# Patient Record
Sex: Female | Born: 1970
Health system: Southern US, Community
[De-identification: ages and names within clinical notes are randomized; demographics above are authoritative.]

## PROBLEM LIST (undated history)

## (undated) DIAGNOSIS — E119 Type 2 diabetes mellitus without complications: Secondary | ICD-10-CM

## (undated) HISTORY — PX: TIBIA FRACTURE SURGERY: SHX806

## (undated) HISTORY — PX: BREAST BIOPSY: SHX20

---
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
## 2006-11-01 ENCOUNTER — Emergency Department: Payer: Self-pay

## 2007-01-28 ENCOUNTER — Ambulatory Visit: Payer: Self-pay | Admitting: Internal Medicine

## 2007-05-11 ENCOUNTER — Emergency Department: Payer: Self-pay | Admitting: Emergency Medicine

## 2007-07-15 ENCOUNTER — Emergency Department: Payer: Self-pay | Admitting: Emergency Medicine

## 2007-07-24 ENCOUNTER — Ambulatory Visit: Payer: Self-pay | Admitting: General Practice

## 2008-01-08 ENCOUNTER — Emergency Department: Payer: Self-pay | Admitting: Emergency Medicine

## 2008-02-18 ENCOUNTER — Ambulatory Visit: Payer: Self-pay | Admitting: Internal Medicine

## 2008-06-06 ENCOUNTER — Ambulatory Visit: Payer: Self-pay | Admitting: Internal Medicine

## 2008-06-07 ENCOUNTER — Ambulatory Visit: Payer: Self-pay | Admitting: Internal Medicine

## 2010-10-27 ENCOUNTER — Emergency Department: Payer: Self-pay | Admitting: Emergency Medicine

## 2010-11-10 ENCOUNTER — Emergency Department (HOSPITAL_COMMUNITY)
Admission: EM | Admit: 2010-11-10 | Discharge: 2010-11-10 | Payer: Self-pay | Source: Home / Self Care | Admitting: Family Medicine

## 2011-02-12 ENCOUNTER — Ambulatory Visit: Payer: Self-pay

## 2011-02-20 ENCOUNTER — Ambulatory Visit: Payer: Self-pay

## 2011-07-18 ENCOUNTER — Emergency Department: Payer: Self-pay | Admitting: Emergency Medicine

## 2011-12-20 ENCOUNTER — Inpatient Hospital Stay: Payer: Self-pay | Admitting: Internal Medicine

## 2011-12-20 LAB — URINALYSIS, COMPLETE
Nitrite: NEGATIVE
Ph: 5 (ref 4.5–8.0)
Protein: 30
RBC,UR: 37 /HPF (ref 0–5)
Specific Gravity: 1.028 (ref 1.003–1.030)
Squamous Epithelial: 13
WBC UR: 38 /HPF (ref 0–5)

## 2011-12-20 LAB — COMPREHENSIVE METABOLIC PANEL
Albumin: 4.2 g/dL (ref 3.4–5.0)
Alkaline Phosphatase: 111 U/L (ref 50–136)
Anion Gap: 17 — ABNORMAL HIGH (ref 7–16)
Calcium, Total: 9.1 mg/dL (ref 8.5–10.1)
Co2: 19 mmol/L — ABNORMAL LOW (ref 21–32)
EGFR (African American): >60
EGFR (Non-African Amer.): >60
Glucose: 412 mg/dL — ABNORMAL HIGH (ref 65–99)
Potassium: 4.5 mmol/L (ref 3.5–5.1)
SGOT(AST): 18 U/L (ref 15–37)
Total Protein: 9 g/dL — ABNORMAL HIGH (ref 6.4–8.2)

## 2011-12-20 LAB — BASIC METABOLIC PANEL
Anion Gap: 14 (ref 7–16)
BUN: 9 mg/dL (ref 7–18)
Co2: 21 mmol/L (ref 21–32)
Creatinine: 0.54 mg/dL — ABNORMAL LOW (ref 0.60–1.30)
EGFR (African American): >60
Glucose: 159 mg/dL — ABNORMAL HIGH (ref 65–99)
Osmolality: 281 (ref 275–301)

## 2011-12-20 LAB — CBC
HCT: 45.2 % (ref 35.0–47.0)
MCH: 29.4 pg (ref 26.0–34.0)
Platelet: 362 10*3/uL (ref 150–440)
RDW: 12.3 % (ref 11.5–14.5)
WBC: 8.9 10*3/uL (ref 3.6–11.0)

## 2011-12-21 LAB — BASIC METABOLIC PANEL
Anion Gap: 11 (ref 7–16)
BUN: 7 mg/dL (ref 7–18)
Co2: 23 mmol/L (ref 21–32)
Creatinine: 0.5 mg/dL — ABNORMAL LOW (ref 0.60–1.30)
EGFR (African American): >60

## 2011-12-21 LAB — CBC WITH DIFFERENTIAL/PLATELET
Basophil %: 0.3 %
Eosinophil #: 0.1 10*3/uL (ref 0.0–0.7)
Eosinophil %: 1.4 %
HCT: 39.3 % (ref 35.0–47.0)
HGB: 13 g/dL (ref 12.0–16.0)
Lymphocyte #: 2.2 10*3/uL (ref 1.0–3.6)
Lymphocyte %: 38.2 %
MCHC: 33.1 g/dL (ref 32.0–36.0)
Monocyte %: 9.1 %
Neutrophil #: 2.9 10*3/uL (ref 1.4–6.5)
Neutrophil %: 51 %
RBC: 4.38 10*6/uL (ref 3.80–5.20)
WBC: 5.7 10*3/uL (ref 3.6–11.0)

## 2011-12-24 LAB — WOUND CULTURE

## 2013-02-09 ENCOUNTER — Ambulatory Visit: Payer: Self-pay | Admitting: Family Medicine

## 2015-02-01 ENCOUNTER — Ambulatory Visit
Admit: 2015-02-01 | Disposition: A | Discharge status: Home / Self Care | Payer: Self-pay | Attending: Nurse Practitioner | Admitting: Nurse Practitioner

## 2015-02-06 ENCOUNTER — Ambulatory Visit
Admit: 2015-02-06 | Disposition: A | Discharge status: Home / Self Care | Payer: Self-pay | Attending: Nurse Practitioner | Admitting: Nurse Practitioner

## 2015-02-12 NOTE — Discharge Summary (Signed)
PATIENT NAME:  Jodi, Rice MR#:  [id number] DATE OF BIRTH:  07-03-1971  DATE OF ADMISSION:  12/20/2011 DATE OF DISCHARGE:  12/21/2011  ADMITTING DIAGNOSES:  1. Hyperglycemia.  2. Left thigh abscess.   DISCHARGE DIAGNOSES:  1. Hyperglycemia with a slightly elevated anion gap, likely related to infection and poorly controlled blood sugars.  2. Diabetes type 2, poorly controlled due to medication noncompliance and inability to afford the medications,  3. Hypertension.  4. Narcolepsy/sleep apnea.  5. Chronic pain.  6. Abnormal liver function tests of unclear etiology, will need repeat as outpatient. 7. Possible urinary tract infection.  CONSULTANTS: None.   PERTINENT LABORATORY, DIAGNOSTIC AND RADIOLOGICAL DATA:  Admitting glucose was 412, BUN 14, creatinine 0.62, sodium 134, potassium 4.5, chloride 98, CO2 slightly low at 19, anion gap 17, and calcium 9.1.  LFTs:  Total protein 9.0, albumin 4.2, alkaline phosphatase 111, AST 18, ALT 23, bilirubin total 1.8.  WBC count 8.9, hemoglobin 15.1, platelet count 362. Urinalysis showed 3+ leukocyte esterase, 38 WBCs. Pregnancy test was negative.  Most recent BMP this morning: Glucose 134, BUN 7, creatinine 0.50, sodium 143, potassium 3.5, chloride 109, CO2 23.   HOSPITAL COURSE: Please see History and Physical done by the admitting physician. The patient is a 44 year old African American female with a history of type 2 diabetes, hypertension, and narcolepsy who ran out of her medications due to inability to afford these medications. She reports especially the Januvia. She had not been taking her medications, presented to the ED with nausea and was noted to have hyperglycemia with a blood glucose in the 400s. The patient also had a small thigh abscess that was incised and drained. The patient was noted to have an elevated anion gap which was slightly elevated; however, no acetone levels were done to confirm DKA. She was started  on insulin drip with IV  hydration, and with only IV hydration and insulin drip her DKA resolved. This morning she is feeling much better and wants to go home. I explained to her that she may need insulin. Currently, the patient reports that she would rather take oral medications. At this time, it is not clear whether she was in DKA. She has been a type 2 diabetic. At this time, we will keep her on an oral regimen. I will have a follow-up BMP as an outpatient. If she still has issues with anion gap being elevated, then she will need insulin therapy. In terms of her thigh abscess, it was very small. It was incised and drained. Currently, there is no drainage, no surrounding erythema or induration. At this time, she will just need to finish p.o. antibiotics. No further incision and drainage at this point is needed. At this time, she is stable for discharge.   DISCHARGE MEDICATIONS:  1. Ibuprofen 600 mg, 1 tab p.o. every 8 hours p.r.n.  2. Omeprazole 20 mg daily.  3. Meloxicam 7.5 as needed.  4. Tramadol 50 mg as needed. 5. Glimepiride 4 mg daily.  6. Metformin 1000 mg p.o. b.i.d.  7. Keflex 500 mg p.o. b.i.d. x5 days. 8. Tylenol 650 p.o. every 6 hours p.r.n. pain.   DIET: ADA diet.   ACTIVITY: As tolerated.   FOLLOWUP: Follow up with primary M.D. next week. The patient is to keep a log of her blood glucose to take to primary M.D.  Also, the patient was told not to take blood pressure medications until seen by primary M.D.   TIME SPENT:   35 minutes.  ____________________________ Jodi Mosses Posey Pronto, MD shp:cbb D: 12/21/2011 08:33:56 ET T: 12/21/2011 09:03:50 ET JOB#: [id number] Jodi Mosses [name] MD ELECTRONICALLY SIGNED 12/27/2011 7:52

## 2015-02-12 NOTE — H&P (Signed)
PATIENT NAME:  Jodi Rice, Jodi Rice MR#:  [id number] DATE OF BIRTH:  03-Jun-1971  DATE OF ADMISSION:  12/20/2011  REFERRING PHYSICIAN: Graciella Freer, MD    PRIMARY CARE PHYSICIAN: None.   REASON FOR ADMISSION: Diabetic ketoacidosis with left thigh abscess.   HISTORY OF PRESENT ILLNESS: The patient is a 44 year old female with a history of type 2 diabetes, benign hypertension, narcolepsy, who has been out of her medications at home for her diabetes for two weeks. She presents to the Emergency Room with weakness and nausea, where she was found to be extremely hyperglycemic in diabetic ketoacidosis. She was also noted to have a left thigh abscess that was incised and drained while in the Emergency Room. She is now on an insulin drip and is admitted for further evaluation.   PAST MEDICAL HISTORY:  1. Type 2 diabetes mellitus.  2. Benign hypertension.  3. Narcolepsy.  4. Chronic pain.   MEDICATIONS:  1. Ultram 50 mg p.o. b.i.d.  2. Prilosec 20 mg p.o. daily.  3. Mobic 7.5 mg p.o. b.i.d.  4. Zestril 5 mg p.o. daily.  5. Januvia 50 mg p.o. daily.  6. Metformin 1000 mg p.o. daily.  7. Amaryl 2 mg p.o. daily.   ALLERGIES: No known drug allergies.   SOCIAL HISTORY: The patient denies alcohol or tobacco abuse.   FAMILY HISTORY: Positive for diabetes, stroke and hypertension.   REVIEW OF SYSTEMS: CONSTITUTIONAL: No fever or change in weight. EYES: No blurred or double vision. No glaucoma. ENT: No tinnitus or hearing loss. No nasal discharge or bleeding. No difficulty swallowing. RESPIRATORY: No cough or wheezing. Denies painful respiration. CARDIOVASCULAR: No chest pain or orthopnea. No palpitations or syncope. GI: Some nausea and vomiting but no diarrhea. No abdominal pain. No change in bowel habits. GU: No dysuria or hematuria. No incontinence. ENDOCRINE: No polyuria or polydipsia. No heat or cold intolerance. HEMATOLOGIC: The patient denies anemia, easy bruising, or bleeding. LYMPHATIC: No swollen  glands. MUSCULOSKELETAL: The patient denies pain in her neck, back, shoulders, knees, or hips. No gout. NEUROLOGICAL: No numbness, although she does have generalized weakness. Denies migraines, stroke or seizures. PSYCHIATRIC: The patient denies anxiety, insomnia, or depression.   PHYSICAL EXAMINATION:  GENERAL: The patient is in no acute distress.   VITAL SIGNS: Vital signs are currently remarkable for a blood pressure of 93/65, with a heart rate of 118, and a respiratory rate of 20. She is afebrile.   HEENT: Normocephalic, atraumatic. Pupils are equally round and reactive to light and accommodation. Extraocular movements are intact. Sclerae are anicteric. Conjunctivae are clear. Oropharynx is clear.    NECK: Supple without jugular venous distention or bruits. No adenopathy or thyromegaly is noted.   LUNGS: Clear to auscultation and percussion without wheezes, rales, or rhonchi. No dullness.   CARDIAC: Rapid rate with a regular rhythm. Normal S1 and S2. No significant rubs, murmurs, or gallops. PMI is nondisplaced. Chest wall is nontender.   ABDOMEN: Soft, nontender, with normoactive bowel sounds. No organomegaly or masses were appreciated. No hernias or bruits were noted.   EXTREMITIES: Without clubbing, cyanosis, edema. Pulses were 2+ bilaterally.   SKIN: Warm and dry without rash. There was a lesion on the inner left thigh which has been incised and has a wick. There is tenderness and surrounding erythema and purulence.   NEUROLOGIC: Cranial nerves II through XII are grossly intact. Deep tendon reflexes were symmetric. Motor and sensory exam is nonfocal.   PSYCHIATRIC: Exam revealed a patient who was alert and oriented  to person, place, and time. She was cooperative and used good judgment.   LABORATORY, DIAGNOSTIC AND RADIOLOGICAL DATA:  EKG revealed sinus tachycardia with no acute ischemic changes.  Chest x-ray was unremarkable.  Arterial blood gas: pH was 7.29.  Initial blood sugar  412 with an anion gap of 17. Sodium was 134.  White count was 8.9 with a hemoglobin of 15.1.   ASSESSMENT:  1. Diabetic ketoacidosis.  2. Hyponatremia.  3. Tachycardia.  4. Hypotension. 5. Left thigh abscess, status post I and D.  6. History of benign hypertension.   PLAN:  1. The patient will be admitted to the Intensive Care Unit on IV insulin per insulin drip protocol. We will begin IV fluids.  2. We will send off blood cultures and begin IV antibiotics.  3. We will follow her sugars according to protocol and adjust her drip as needed. We will continue her Zestril, Januvia, and metformin at this time. We will hold her Amaryl.  4. Follow up routine labs in the morning.  5. We will consult the Wound team in regards to her left thigh abscess.   Further treatment and evaluation will depend upon the patient's progress.   TOTAL TIME SPENT:  50 minutes.  ____________________________ Leonie Douglas Doy Hutching, MD jds:cbb D: 12/20/2011 16:50:51 ET T: 12/20/2011 17:15:18 ET JOB#: [id number] Brylin Stanislawski D Zohair Epp MD ELECTRONICALLY SIGNED 12/20/2011 19:26

## 2015-02-28 ENCOUNTER — Ambulatory Visit: Payer: Self-pay | Admitting: Dietician

## 2015-03-21 ENCOUNTER — Ambulatory Visit: Payer: Self-pay | Admitting: Dietician

## 2015-03-24 ENCOUNTER — Ambulatory Visit: Payer: Self-pay | Admitting: Dietician

## 2015-04-28 ENCOUNTER — Encounter: Payer: Self-pay | Admitting: *Deleted

## 2016-03-05 ENCOUNTER — Other Ambulatory Visit: Payer: Self-pay | Admitting: Obstetrics and Gynecology

## 2016-03-05 DIAGNOSIS — Z1231 Encounter for screening mammogram for malignant neoplasm of breast: Secondary | ICD-10-CM

## 2016-03-14 ENCOUNTER — Ambulatory Visit
Admission: RE | Admit: 2016-03-14 | Discharge: 2016-03-14 | Disposition: A | Discharge status: Home / Self Care | Payer: Commercial Indemnity | Source: Ambulatory Visit | Attending: Obstetrics and Gynecology | Admitting: Obstetrics and Gynecology

## 2016-03-14 DIAGNOSIS — Z1231 Encounter for screening mammogram for malignant neoplasm of breast: Secondary | ICD-10-CM | POA: Insufficient documentation

## 2016-04-11 ENCOUNTER — Ambulatory Visit: Payer: Self-pay | Admitting: Podiatry

## 2016-04-17 ENCOUNTER — Other Ambulatory Visit: Payer: Self-pay | Admitting: Obstetrics and Gynecology

## 2016-04-17 DIAGNOSIS — Z136 Encounter for screening for cardiovascular disorders: Secondary | ICD-10-CM

## 2016-05-03 ENCOUNTER — Ambulatory Visit: Payer: Managed Care, Other (non HMO) | Admitting: Sports Medicine

## 2016-05-13 ENCOUNTER — Ambulatory Visit: Payer: Commercial Indemnity | Admitting: Dietician

## 2016-05-20 ENCOUNTER — Encounter: Payer: Self-pay | Admitting: Dietician

## 2016-05-20 ENCOUNTER — Encounter: Payer: Managed Care, Other (non HMO) | Attending: Obstetrics and Gynecology | Admitting: Dietician

## 2016-05-20 DIAGNOSIS — E119 Type 2 diabetes mellitus without complications: Secondary | ICD-10-CM | POA: Diagnosis not present

## 2016-05-20 NOTE — Patient Instructions (Signed)
  Check blood sugars 2 x day before breakfast and 2 hrs after supper every day Exercise:  Walk 15 min. 2-3x/wk but ONLY if sugars are under 250 Avoid sugar sweetened drinks (soda, tea, coffee, sports drinks, juices) Limit intake of sweets and fried foods Make healthy food choices Eat 3 meals day,   2  snacks a day-eat AM snack + afternoon snack if eating late supper Eat 2-3 carbohydrate servings/meal + protein Eat 1 carbohydrate serving/snack + protein Space meals 4-6 hours apart Drink lots of water Make dentist / eye doctor appointments Bring blood sugar records to the next appointment/class Get a Sharps container Pick up testing supplies at [employer] and fill [employer]AA and take as directed by MD Return for appointment/classes on:  06-17-16

## 2016-05-20 NOTE — Progress Notes (Signed)
Diabetes Self-Management Education  Visit Type: First/Initial  Appt. Start Time:1500 Appt. End Time:1600  05/20/2016  Jodi Rice, identified by name and date of birth, is a 45 y.o. female with a diagnosis of Diabetes: Type 2.   ASSESSMENT  Blood pressure 132/82, height 5\' 8"  (1.727 m), weight 191 lb 3.2 oz (86.7 kg). Body mass index is 29.07 kg/m.      Diabetes Self-Management Education - 05/20/16 1622      Visit Information   Visit Type First/Initial     Initial Visit   Diabetes Type Type 2     Health Coping   How would you rate your overall health? Good     Psychosocial Assessment   Patient Belief/Attitude about Diabetes Motivated to manage diabetes  did not seem interested   Self-care barriers None   Other persons present Family Member   Patient Concerns Glycemic Control;Healthy Lifestyle   Special Needs None   Preferred Learning Style Hands on   Learning Readiness Contemplating   What is the last grade level you completed in school? 11     Pre-Education Assessment   Patient understands the diabetes disease and treatment process. Needs Review   Patient understands incorporating nutritional management into lifestyle. Needs Review   Patient undertands incorporating physical activity into lifestyle. Needs Review   Patient understands using medications safely. Needs Review  pt reports was given RX for Janumet but has never picked it up   Patient understands monitoring blood glucose, interpreting and using results Needs Review  reports checks BG's 2x/wk (FBG and bedtime) with recent results 200's-300's; pt is out of test strips   Patient understands prevention, detection, and treatment of acute complications. Needs Review   Patient understands prevention, detection, and treatment of chronic complications. Needs Review   Patient understands how to develop strategies to address psychosocial issues. Needs Review   Patient understands how to develop strategies to  promote health/change behavior. Needs Review     Complications   How often do you check your blood sugar? --  2x/wk   Fasting Blood glucose range (mg/dL) >200  FBG and at bedtime-200's-300's   Have you had a dilated eye exam in the past 12 months? Yes  about 1 year ago   Have you had a dental exam in the past 12 months? Yes  about 1 year ago   Are you checking your feet? Yes   How many days per week are you checking your feet? 7     Dietary Intake   Breakfast --  eats breakfast at 5:30a (works 6a-2p 5x/wk + 5p-11p 2x/wk.)   Snack (morning) --  eats snack at 8a-snack foods 4-5x/wk.   Lunch --  eats lunch at 11a-eats sweets 4-5x/wk.   Snack (afternoon) --  none   Dinner --  eats supper at 8p   Beverage(s) --  drinks water 2-2x/day and occasional sweet tea; drinks diet sodas and unsweet tea 2-3x/day     Exercise   Exercise Type ADL's     Patient Education   [facility] comment[facility]   Disease state  Definition of diabetes, type 1 and 2, and the diagnosis of diabetes;Explored patient's options for treatment of their diabetes   Nutrition management  Role of diet in the treatment of diabetes and the relationship between the three main macronutrients and blood glucose level;Food label reading, portion sizes and measuring food.;Carbohydrate counting   Physical activity and exercise  Role of exercise on diabetes management, blood pressure control and cardiac health.;Helped patient identify appropriate exercises in relation to his/her diabetes, diabetes complications and other health issue.   Medications Reviewed patients medication for diabetes, action, purpose, timing of dose and side effects.   Monitoring Purpose and frequency of SMBG.;Taught/discussed recording of test results and interpretation of SMBG.;Identified appropriate SMBG and/or A1C goals.;Yearly dilated eye exam   Acute complications Discussed and identified  patients' treatment of hyperglycemia.   Chronic complications Relationship between chronic complications and blood glucose control;Dental care;Retinopathy and reason for yearly dilated eye exams   Psychosocial adjustment Role of stress on diabetes   Personal strategies to promote health Lifestyle issues that need to be addressed for better diabetes care;Helped patient develop diabetes management plan for (enter comment)     Outcomes   Expected Outcomes Demonstrated limited interest in learning.  Expect minimal changes      Individualized Plan for Diabetes Self-Management Training:   Learning Objective:  Patient will have a greater understanding of diabetes self-management. Patient education plan is to attend individual and/or group sessions per assessed needs and concerns.   Plan:   Patient Instructions   Check blood sugars 2 x day before breakfast and 2 hrs after supper every day Exercise:  Walk 15 min. 2-3x/wk but ONLY if sugars are under 250 Avoid sugar sweetened drinks (soda, tea, coffee, sports drinks, juices) Limit intake of sweets and fried foods Make healthy food choices Eat 3 meals day,   2  snacks a day-eat AM snack + afternoon snack if eating late supper Eat 2-3 carbohydrate servings/meal + protein Eat 1 carbohydrate serving/snack + protein Space meals 4-6 hours apart Drink lots of water Make dentist / eye doctor appointments Bring blood sugar records to the next appointment/class Get a Sharps container Pick up testing supplies at [employer] and fill USAA, Provigil  and Lisinopril and take as directed by MD Return for appointment/classes on:  06-17-16   Expected Outcomes:  Demonstrated limited interest in learning.  Expect minimal changes  Education material provided: General meal planning guidelines  If problems or questions, patient to contact team via:  [phone]  Future DSME appointment:   06-17-16

## 2016-06-17 ENCOUNTER — Ambulatory Visit: Payer: Managed Care, Other (non HMO)

## 2016-06-19 ENCOUNTER — Telehealth: Payer: Self-pay | Admitting: Dietician

## 2016-06-19 NOTE — Telephone Encounter (Signed)
Called patient regarding class which she missed on 06/17/16. She reports that she forgot about it. She rescheduled the series to begin on 07/15/16.

## 2016-07-01 ENCOUNTER — Ambulatory Visit: Payer: Managed Care, Other (non HMO)

## 2016-07-04 DIAGNOSIS — E1140 Type 2 diabetes mellitus with diabetic neuropathy, unspecified: Secondary | ICD-10-CM | POA: Insufficient documentation

## 2016-07-08 ENCOUNTER — Ambulatory Visit: Payer: Managed Care, Other (non HMO)

## 2016-08-12 ENCOUNTER — Encounter: Payer: Managed Care, Other (non HMO) | Attending: Obstetrics and Gynecology | Admitting: Dietician

## 2016-08-12 ENCOUNTER — Encounter: Payer: Self-pay | Admitting: Dietician

## 2016-08-12 VITALS — Ht 68.0 in | Wt 185.5 lb

## 2016-08-12 DIAGNOSIS — Z713 Dietary counseling and surveillance: Secondary | ICD-10-CM | POA: Insufficient documentation

## 2016-08-12 DIAGNOSIS — E119 Type 2 diabetes mellitus without complications: Secondary | ICD-10-CM

## 2016-08-12 NOTE — Progress Notes (Signed)

## 2016-08-19 ENCOUNTER — Encounter: Payer: Managed Care, Other (non HMO) | Admitting: Dietician

## 2016-08-19 VITALS — Wt 186.0 lb

## 2016-08-19 DIAGNOSIS — Z713 Dietary counseling and surveillance: Secondary | ICD-10-CM | POA: Diagnosis not present

## 2016-08-19 DIAGNOSIS — E119 Type 2 diabetes mellitus without complications: Secondary | ICD-10-CM

## 2016-08-19 NOTE — Progress Notes (Signed)

## 2016-08-26 ENCOUNTER — Encounter: Payer: Managed Care, Other (non HMO) | Attending: Obstetrics and Gynecology | Admitting: Dietician

## 2016-08-26 VITALS — BP 118/80 | Ht 68.0 in | Wt 187.9 lb

## 2016-08-26 DIAGNOSIS — Z713 Dietary counseling and surveillance: Secondary | ICD-10-CM | POA: Diagnosis present

## 2016-08-26 DIAGNOSIS — E119 Type 2 diabetes mellitus without complications: Secondary | ICD-10-CM

## 2016-09-03 ENCOUNTER — Encounter: Payer: Self-pay | Admitting: Dietician

## 2017-05-29 ENCOUNTER — Emergency Department
Admission: EM | Admit: 2017-05-29 | Discharge: 2017-05-30 | Disposition: A | Discharge status: Home / Self Care | Payer: Managed Care, Other (non HMO) | Attending: Emergency Medicine | Admitting: Emergency Medicine

## 2017-05-29 ENCOUNTER — Encounter: Payer: Self-pay | Admitting: Emergency Medicine

## 2017-05-29 DIAGNOSIS — Z87891 Personal history of nicotine dependence: Secondary | ICD-10-CM | POA: Insufficient documentation

## 2017-05-29 DIAGNOSIS — R739 Hyperglycemia, unspecified: Secondary | ICD-10-CM | POA: Diagnosis present

## 2017-05-29 DIAGNOSIS — H1032 Unspecified acute conjunctivitis, left eye: Secondary | ICD-10-CM

## 2017-05-29 DIAGNOSIS — Z9114 Patient's other noncompliance with medication regimen: Secondary | ICD-10-CM | POA: Insufficient documentation

## 2017-05-29 DIAGNOSIS — E1165 Type 2 diabetes mellitus with hyperglycemia: Secondary | ICD-10-CM | POA: Insufficient documentation

## 2017-05-29 HISTORY — DX: Type 2 diabetes mellitus without complications: E11.9

## 2017-05-29 LAB — COMPREHENSIVE METABOLIC PANEL
ALBUMIN: 3.8 g/dL (ref 3.5–5.0)
ALT: 17 U/L (ref 14–54)
ANION GAP: 10 (ref 5–15)
AST: 20 U/L (ref 15–41)
Alkaline Phosphatase: 102 U/L (ref 38–126)
BILIRUBIN TOTAL: 1.2 mg/dL (ref 0.3–1.2)
BUN: 12 mg/dL (ref 6–20)
CO2: 23 mmol/L (ref 22–32)
Calcium: 9.1 mg/dL (ref 8.9–10.3)
Chloride: 101 mmol/L (ref 101–111)
Creatinine, Ser: 0.64 mg/dL (ref 0.44–1.00)
GFR calc Af Amer: >60 mL/min (ref >60)
GFR calc non Af Amer: >60 mL/min (ref >60)
GLUCOSE: 397 mg/dL — AB (ref 65–99)
POTASSIUM: 4.2 mmol/L (ref 3.5–5.1)
SODIUM: 134 mmol/L — AB (ref 135–145)
TOTAL PROTEIN: 7.7 g/dL (ref 6.5–8.1)

## 2017-05-29 LAB — URINALYSIS, COMPLETE (UACMP) WITH MICROSCOPIC
BILIRUBIN URINE: NEGATIVE
Glucose, UA: >=500 mg/dL — AB
KETONES UR: 5 mg/dL — AB
Nitrite: POSITIVE — AB
PROTEIN: NEGATIVE mg/dL
Specific Gravity, Urine: 1.027 (ref 1.005–1.030)
pH: 5 (ref 5.0–8.0)

## 2017-05-29 LAB — CBC
HCT: 42.2 % (ref 35.0–47.0)
Hemoglobin: 14.3 g/dL (ref 12.0–16.0)
MCH: 29.5 pg (ref 26.0–34.0)
MCHC: 33.9 g/dL (ref 32.0–36.0)
MCV: 86.9 fL (ref 80.0–100.0)
Platelets: 512 10*3/uL — ABNORMAL HIGH (ref 150–440)
RBC: 4.85 MIL/uL (ref 3.80–5.20)
RDW: 12.7 % (ref 11.5–14.5)
WBC: 8.1 10*3/uL (ref 3.6–11.0)

## 2017-05-29 LAB — GLUCOSE, CAPILLARY: Glucose-Capillary: 378 mg/dL — ABNORMAL HIGH (ref 65–99)

## 2017-05-29 LAB — POCT PREGNANCY, URINE: Preg Test, Ur: NEGATIVE

## 2017-05-29 NOTE — ED Notes (Signed)
Pt ambulatory to desk in NAD, report hasn't been feeling well, states she has diabetes and feels her glucose is off.  Pt asked if she has means of checking BG at home, pt states she does but did not check.

## 2017-05-29 NOTE — ED Triage Notes (Addendum)
Patient ambulatory to triage with steady gait, without difficulty or distress noted; pt reports feels as if her glucose is elevated; st has no supplies to check it and not feeling well, lower abd pain, diarrhea

## 2017-05-30 LAB — GLUCOSE, CAPILLARY
GLUCOSE-CAPILLARY: 238 mg/dL — AB (ref 65–99)
Glucose-Capillary: 395 mg/dL — ABNORMAL HIGH (ref 65–99)

## 2017-05-30 MED ORDER — ERYTHROMYCIN 5 MG/GM OP OINT
TOPICAL_OINTMENT | OPHTHALMIC | Status: AC
  Filled 2017-05-30: qty 1

## 2017-05-30 MED ORDER — SODIUM CHLORIDE 0.9 % IV BOLUS (SEPSIS)
1000.0000 mL | Freq: Once | INTRAVENOUS | Status: AC
  Administered 2017-05-30: 1000 mL via INTRAVENOUS

## 2017-05-30 MED ORDER — INSULIN ASPART 100 UNIT/ML ~~LOC~~ SOLN
8.0000 [IU] | Freq: Once | SUBCUTANEOUS | Status: AC
  Administered 2017-05-30: 8 [IU] via INTRAVENOUS

## 2017-05-30 MED ORDER — INSULIN ASPART 100 UNIT/ML ~~LOC~~ SOLN
SUBCUTANEOUS | Status: AC
  Administered 2017-05-30: 8 [IU] via INTRAVENOUS
  Filled 2017-05-30: qty 1

## 2017-05-30 MED ORDER — ERYTHROMYCIN 5 MG/GM OP OINT
TOPICAL_OINTMENT | Freq: Once | OPHTHALMIC | Status: AC
  Administered 2017-05-30: 1 via OPHTHALMIC

## 2017-05-30 MED ORDER — SITAGLIPTIN PHOS-METFORMIN HCL 50-1000 MG PO TABS: 1.0000 | ORAL_TABLET | Freq: Two times a day (BID) | ORAL | 0 refills | Status: DC

## 2017-05-30 MED ORDER — ERYTHROMYCIN 5 MG/GM OP OINT: TOPICAL_OINTMENT | Freq: Three times a day (TID) | OPHTHALMIC | 0 refills | Status: AC

## 2017-05-30 NOTE — ED Notes (Signed)
Pt reports "high blood sugar" for the last week, pt states hx of DM type 2 and reports she has not taken her medication for "months." Pt states she has not followed up with PCP for medication but states she does need to follow up with her PCP after this visit. Pt states frequent urination. Pt A&O and in NAD at this time.

## 2017-05-30 NOTE — ED Notes (Signed)

## 2017-05-30 NOTE — ED Provider Notes (Signed)
[facility] Emergency Department Provider Note   Time seen: 1:00 AM  I have reviewed the triage vital signs and the nursing notes.   HISTORY  Chief Complaint Hyperglycemia    HPI Jodi Rice is a 46 y.o. female with history of diabetes mellitus noncompliant with Janumet sector thought the patient states that she is ran out for greater than a week. Patient presents to the emergency department stating that her glucose was elevated. Patient admits to increased thirst and increased urination. Patient noted to have a glucose of 378 on arrival. Patient also admits to discharge from the left eye 2 days.   Past Medical History:  Diagnosis Date  . Diabetes mellitus without complication ([place])     There are no active problems to display for this patient.   Past Surgical History:  Procedure Laterality Date  . BREAST BIOPSY Right 1980's    Prior to Admission medications   Not on File    Allergies No known drug allergies  Family History  Problem Relation Age of Onset  . Breast cancer Maternal Aunt 60    Social History Social History  Substance Use Topics  . Smoking status: Former Research scientist (life sciences)  . Smokeless tobacco: Never Used  . Alcohol use No    Review of Systems Constitutional: No fever/chills Eyes: No visual changes.Positive for left eye discharge ENT: No sore throat. Cardiovascular: Denies chest pain. Respiratory: Denies shortness of breath. Gastrointestinal: No abdominal pain.  No nausea, no vomiting.  No diarrhea.  No constipation. Genitourinary: Negative for dysuria. Musculoskeletal: Negative for neck pain.  Negative for back pain. Integumentary: Negative for rash. Neurological: Negative for headaches, focal weakness or numbness. Endocrine: Positive for hyperglycemia  ____________________________________________   PHYSICAL EXAM:  VITAL SIGNS: ED Triage Vitals [05/29/17 2225]  Enc Vitals Group     BP (!) 149/90     Pulse Rate 92     Resp 18     Temp 98.1 F (36.7 C)     Temp Source Oral     SpO2 98 %     Weight 83.5 kg (184 lb)     Height 1.727 m (5\' 8" )     Head Circumference      Peak Flow      Pain Score 7     Pain Loc      Pain Edu?      Excl. in [place]?     Constitutional: Alert and oriented. Well appearing and in no acute distress. Eyes: Conjunctivae are Erythematous on the left with exudate noted Head: Atraumatic. Ears:  Healthy appearing ear canals and TMs bilaterally Nose: No congestion/rhinnorhea. Mouth/Throat: Mucous membranes are dry Neck: No stridor.   Cardiovascular: Normal rate, regular rhythm. Good peripheral circulation. Grossly normal heart sounds. Respiratory: Normal respiratory effort.  No retractions. Lungs CTAB. Gastrointestinal: Soft and nontender. No distention.  Musculoskeletal: No lower extremity tenderness nor edema. No gross deformities of extremities. Neurologic:  Normal speech and language. No gross focal neurologic deficits are appreciated.  Skin:  Skin is warm, dry and intact. No rash noted. Psychiatric: Mood and affect are normal. Speech and behavior are normal.  ____________________________________________   LABS (all labs ordered are listed, but only abnormal results are displayed)  Labs Reviewed  CBC - Abnormal; Notable for the following:       Result Value   Platelets 512 (*)    All other components within normal limits  URINALYSIS, COMPLETE (UACMP) WITH MICROSCOPIC - Abnormal; Notable for the following:  Color, Urine YELLOW (*)    APPearance HAZY (*)    Glucose, UA >=500 (*)    Hgb urine dipstick SMALL (*)    Ketones, ur 5 (*)    Nitrite POSITIVE (*)    Leukocytes, UA SMALL (*)    Bacteria, UA RARE (*)    Squamous Epithelial / LPF 6-30 (*)    All other components within normal limits  COMPREHENSIVE METABOLIC PANEL - Abnormal; Notable for the following:    Sodium 134 (*)    Glucose, Bld 397 (*)    All other components within normal limits  GLUCOSE,  CAPILLARY - Abnormal; Notable for the following:    Glucose-Capillary 378 (*)    All other components within normal limits  GLUCOSE, CAPILLARY - Abnormal; Notable for the following:    Glucose-Capillary 395 (*)    All other components within normal limits  GLUCOSE, CAPILLARY - Abnormal; Notable for the following:    Glucose-Capillary 238 (*)    All other components within normal limits  CBG MONITORING, ED  POCT PREGNANCY, URINE      Procedures   ____________________________________________   INITIAL IMPRESSION / ASSESSMENT AND PLAN / ED COURSE  Pertinent labs & imaging results that were available during my care of the patient were reviewed by me and considered in my medical decision making (see chart for details).  46 year old female with history diabetes mellitus with medication noncompliance presents with hyperglycemia. Patient was given 2 L IV normal saline and subsequently 8 units of insulin with improvement of blood glucose to 238 before discharge. In addition patient noted to have left conjunctivitis and a such erythromycin ophthalmic was applied      ____________________________________________  FINAL CLINICAL IMPRESSION(S) / ED DIAGNOSES  Final diagnoses:  Hyperglycemia  Acute bacterial conjunctivitis of left eye     MEDICATIONS GIVEN DURING THIS VISIT:  Medications  sodium chloride 0.9 % bolus 1,000 mL (0 mLs Intravenous Stopped 05/30/17 0140)  sodium chloride 0.9 % bolus 1,000 mL (0 mLs Intravenous Stopped 05/30/17 0203)  insulin aspart (novoLOG) injection 8 Units (8 Units Intravenous Given 05/30/17 0330)     NEW OUTPATIENT MEDICATIONS STARTED DURING THIS VISIT:  New Prescriptions   No medications on file    Modified Medications   No medications on file    Discontinued Medications   ASPIRIN 325 MG EC TABLET    Take 325 mg by mouth as needed for pain (takes 2 tablets as needed for headache).   CINNAMON PO    Take 2 tablets by mouth 2 (two) times  daily.   LISINOPRIL (PRINIVIL,ZESTRIL) 5 MG TABLET    Take 5 mg by mouth daily.   METRONIDAZOLE (METROGEL) 1.06 % GEL    1 application 2 (two) times daily.   MODAFINIL (PROVIGIL) 100 MG TABLET    Take 100 mg by mouth daily.   SITAGLIPTIN-METFORMIN (JANUMET) 50-1000 MG TABLET    Take 1 tablet by mouth 2 (two) times daily with a meal.     Note:  This document was prepared using Dragon voice recognition software and may include unintentional dictation errors.    Gregor Hams, MD 05/30/17 [phone]

## 2017-05-30 NOTE — ED Notes (Addendum)
BLOOD SUGAR RECHECK (post fluids): 395

## 2017-07-24 ENCOUNTER — Other Ambulatory Visit: Payer: Self-pay | Admitting: Obstetrics and Gynecology

## 2017-07-24 DIAGNOSIS — Z1231 Encounter for screening mammogram for malignant neoplasm of breast: Secondary | ICD-10-CM

## 2017-08-26 ENCOUNTER — Telehealth: Payer: Self-pay | Admitting: Gastroenterology

## 2017-08-26 NOTE — Telephone Encounter (Signed)
Patient called and is ready to schedule a colonoscopy.

## 2017-08-28 ENCOUNTER — Telehealth: Payer: Self-pay

## 2017-08-28 ENCOUNTER — Other Ambulatory Visit: Payer: Self-pay

## 2017-08-28 ENCOUNTER — Ambulatory Visit
Admission: RE | Admit: 2017-08-28 | Discharge: 2017-08-28 | Disposition: A | Discharge status: Home / Self Care | Payer: Managed Care, Other (non HMO) | Source: Ambulatory Visit | Attending: Obstetrics and Gynecology | Admitting: Obstetrics and Gynecology

## 2017-08-28 DIAGNOSIS — Z1211 Encounter for screening for malignant neoplasm of colon: Secondary | ICD-10-CM

## 2017-08-28 DIAGNOSIS — R921 Mammographic calcification found on diagnostic imaging of breast: Secondary | ICD-10-CM | POA: Insufficient documentation

## 2017-08-28 DIAGNOSIS — R928 Other abnormal and inconclusive findings on diagnostic imaging of breast: Secondary | ICD-10-CM | POA: Insufficient documentation

## 2017-08-28 DIAGNOSIS — Z1231 Encounter for screening mammogram for malignant neoplasm of breast: Secondary | ICD-10-CM | POA: Diagnosis present

## 2017-08-28 NOTE — Telephone Encounter (Signed)
Returned call to schedule patients colonoscopy however her voice mailbox is full and I could not leave a message.  Thanks [employer]

## 2017-08-28 NOTE — Telephone Encounter (Signed)
Gastroenterology Pre-Procedure Review  Request Date: 09/09/17 Requesting Physician: Dr. Allen Norris  PATIENT REVIEW QUESTIONS: The patient responded to the following health history questions as indicated:    1. Are you having any GI issues? no 2. Do you have a personal history of Polyps? no 3. Do you have a family history of Colon Cancer or Polyps? no 4. Diabetes Mellitus? yes (Type 2) 5. Joint replacements in the past 12 months?no 6. Major health problems in the past 3 months?no 7. Any artificial heart valves, MVP, or defibrillator?no    MEDICATIONS & ALLERGIES:    Patient reports the following regarding taking any anticoagulation/antiplatelet therapy:   Plavix, Coumadin, Eliquis, Xarelto, Lovenox, Pradaxa, Brilinta, or Effient? no Aspirin? no  Patient confirms/reports the following medications:  Current Outpatient Medications  Medication Sig Dispense Refill  . sitaGLIPtin-metformin (JANUMET) 50-1000 MG tablet Take 1 tablet by mouth 2 (two) times daily with a meal. 60 tablet 0   No current facility-administered medications for this visit.     Patient confirms/reports the following allergies:  No Known Allergies  No orders of the defined types were placed in this encounter.   AUTHORIZATION INFORMATION Primary Insurance: 1D#: Group #:  Secondary Insurance: 1D#: Group #:  SCHEDULE INFORMATION: Date: 09/09/17 Time: Location:[facility]

## 2017-09-03 ENCOUNTER — Other Ambulatory Visit: Payer: Self-pay | Admitting: Nephrology

## 2017-09-03 ENCOUNTER — Other Ambulatory Visit: Payer: Self-pay | Admitting: Obstetrics and Gynecology

## 2017-09-03 DIAGNOSIS — R928 Other abnormal and inconclusive findings on diagnostic imaging of breast: Secondary | ICD-10-CM

## 2017-09-03 DIAGNOSIS — R808 Other proteinuria: Secondary | ICD-10-CM

## 2017-09-03 DIAGNOSIS — R921 Mammographic calcification found on diagnostic imaging of breast: Secondary | ICD-10-CM

## 2017-09-08 ENCOUNTER — Ambulatory Visit
Admission: RE | Admit: 2017-09-08 | Discharge: 2017-09-08 | Disposition: A | Discharge status: Home / Self Care | Payer: Managed Care, Other (non HMO) | Source: Ambulatory Visit | Attending: Nephrology | Admitting: Nephrology

## 2017-09-08 DIAGNOSIS — R808 Other proteinuria: Secondary | ICD-10-CM

## 2017-09-09 ENCOUNTER — Encounter: Admission: RE | Payer: Self-pay | Source: Ambulatory Visit

## 2017-09-09 ENCOUNTER — Ambulatory Visit
Admission: RE | Admit: 2017-09-09 | Payer: Managed Care, Other (non HMO) | Source: Ambulatory Visit | Admitting: Gastroenterology

## 2017-09-09 SURGERY — COLONOSCOPY WITH PROPOFOL
Anesthesia: General

## 2017-09-17 ENCOUNTER — Other Ambulatory Visit: Payer: Managed Care, Other (non HMO)

## 2017-09-17 ENCOUNTER — Ambulatory Visit: Payer: Managed Care, Other (non HMO)

## 2017-09-25 ENCOUNTER — Ambulatory Visit
Admission: RE | Admit: 2017-09-25 | Discharge: 2017-09-25 | Disposition: A | Discharge status: Home / Self Care | Payer: Managed Care, Other (non HMO) | Source: Ambulatory Visit | Attending: Obstetrics and Gynecology | Admitting: Obstetrics and Gynecology

## 2017-09-25 ENCOUNTER — Ambulatory Visit: Payer: Managed Care, Other (non HMO)

## 2017-09-25 DIAGNOSIS — R921 Mammographic calcification found on diagnostic imaging of breast: Secondary | ICD-10-CM | POA: Diagnosis present

## 2017-09-25 DIAGNOSIS — R928 Other abnormal and inconclusive findings on diagnostic imaging of breast: Secondary | ICD-10-CM

## 2017-11-26 ENCOUNTER — Other Ambulatory Visit: Payer: Self-pay

## 2017-11-26 ENCOUNTER — Telehealth: Payer: Self-pay | Admitting: Gastroenterology

## 2017-11-26 DIAGNOSIS — Z1211 Encounter for screening for malignant neoplasm of colon: Secondary | ICD-10-CM

## 2017-11-26 NOTE — Telephone Encounter (Signed)
Patient left a voice message that she needs to schedule a colonoscopy. She would like for you to call  Her today

## 2017-11-26 NOTE — Telephone Encounter (Signed)
Gastroenterology Pre-Procedure Review  Request Date: 12/16/17 Requesting Physician: Dr. Marius Ditch  PATIENT REVIEW QUESTIONS: The patient responded to the following health history questions as indicated:    1. Are you having any GI issues? no 2. Do you have a personal history of Polyps? no 3. Do you have a family history of Colon Cancer or Polyps? no 4. Diabetes Mellitus? no 5. Joint replacements in the past 12 months?no 6. Major health problems in the past 3 months?no 7. Any artificial heart valves, MVP, or defibrillator?no    MEDICATIONS & ALLERGIES:    Patient reports the following regarding taking any anticoagulation/antiplatelet therapy:   Plavix, Coumadin, Eliquis, Xarelto, Lovenox, Pradaxa, Brilinta, or Effient? no Aspirin? no  Patient confirms/reports the following medications:  Current Outpatient Medications  Medication Sig Dispense Refill  . sitaGLIPtin-metformin (JANUMET) 50-1000 MG tablet Take 1 tablet by mouth 2 (two) times daily with a meal. 60 tablet 0   No current facility-administered medications for this visit.     Patient confirms/reports the following allergies:  No Known Allergies  No orders of the defined types were placed in this encounter.   AUTHORIZATION INFORMATION Primary Insurance: 1D#: Group #:  Secondary Insurance: 1D#: Group #:  SCHEDULE INFORMATION: Date: 12/16/17 Time: Location:[facility]

## 2017-12-16 ENCOUNTER — Ambulatory Visit: Payer: Managed Care, Other (non HMO) | Admitting: Anesthesiology

## 2017-12-16 ENCOUNTER — Ambulatory Visit
Admission: RE | Admit: 2017-12-16 | Discharge: 2017-12-16 | Disposition: A | Discharge status: Home / Self Care | Payer: Managed Care, Other (non HMO) | Source: Ambulatory Visit | Attending: Gastroenterology | Admitting: Gastroenterology

## 2017-12-16 ENCOUNTER — Encounter
Admission: RE | Disposition: A | Discharge status: Home / Self Care | Payer: Self-pay | Source: Ambulatory Visit | Attending: Gastroenterology

## 2017-12-16 ENCOUNTER — Encounter: Payer: Self-pay | Admitting: *Deleted

## 2017-12-16 ENCOUNTER — Other Ambulatory Visit: Payer: Self-pay

## 2017-12-16 DIAGNOSIS — Z7984 Long term (current) use of oral hypoglycemic drugs: Secondary | ICD-10-CM | POA: Diagnosis not present

## 2017-12-16 DIAGNOSIS — D125 Benign neoplasm of sigmoid colon: Secondary | ICD-10-CM | POA: Diagnosis not present

## 2017-12-16 DIAGNOSIS — Z1211 Encounter for screening for malignant neoplasm of colon: Secondary | ICD-10-CM | POA: Diagnosis not present

## 2017-12-16 DIAGNOSIS — D123 Benign neoplasm of transverse colon: Secondary | ICD-10-CM | POA: Insufficient documentation

## 2017-12-16 DIAGNOSIS — D120 Benign neoplasm of cecum: Secondary | ICD-10-CM | POA: Insufficient documentation

## 2017-12-16 DIAGNOSIS — Z87891 Personal history of nicotine dependence: Secondary | ICD-10-CM | POA: Insufficient documentation

## 2017-12-16 DIAGNOSIS — K648 Other hemorrhoids: Secondary | ICD-10-CM | POA: Insufficient documentation

## 2017-12-16 DIAGNOSIS — Z803 Family history of malignant neoplasm of breast: Secondary | ICD-10-CM | POA: Insufficient documentation

## 2017-12-16 DIAGNOSIS — E119 Type 2 diabetes mellitus without complications: Secondary | ICD-10-CM | POA: Diagnosis not present

## 2017-12-16 HISTORY — PX: COLONOSCOPY WITH PROPOFOL: SHX5780

## 2017-12-16 LAB — POCT PREGNANCY, URINE: Preg Test, Ur: NEGATIVE

## 2017-12-16 LAB — GLUCOSE, CAPILLARY: Glucose-Capillary: 265 mg/dL — ABNORMAL HIGH (ref 65–99)

## 2017-12-16 SURGERY — COLONOSCOPY WITH PROPOFOL
Anesthesia: General

## 2017-12-16 MED ORDER — LIDOCAINE HCL (PF) 2 % IJ SOLN
INTRAMUSCULAR | Status: AC
  Filled 2017-12-16: qty 10

## 2017-12-16 MED ORDER — LIDOCAINE HCL (CARDIAC) 20 MG/ML IV SOLN
INTRAVENOUS | Status: DC | PRN
  Administered 2017-12-16: 50 mg via INTRATRACHEAL

## 2017-12-16 MED ORDER — SODIUM CHLORIDE 0.9 % IV SOLN
INTRAVENOUS | Status: DC
  Administered 2017-12-16: 1000 mL via INTRAVENOUS

## 2017-12-16 MED ORDER — PHENYLEPHRINE HCL 10 MG/ML IJ SOLN
INTRAMUSCULAR | Status: DC | PRN
  Administered 2017-12-16: 100 ug via INTRAVENOUS

## 2017-12-16 MED ORDER — PROPOFOL 10 MG/ML IV BOLUS
INTRAVENOUS | Status: DC | PRN
  Administered 2017-12-16: 50 mg via INTRAVENOUS

## 2017-12-16 MED ORDER — PROPOFOL 500 MG/50ML IV EMUL
INTRAVENOUS | Status: DC | PRN
  Administered 2017-12-16: 200 ug/kg/min via INTRAVENOUS

## 2017-12-16 MED ORDER — PROPOFOL 500 MG/50ML IV EMUL
INTRAVENOUS | Status: AC
  Filled 2017-12-16: qty 50

## 2017-12-16 MED ORDER — PROPOFOL 10 MG/ML IV BOLUS
INTRAVENOUS | Status: AC
  Filled 2017-12-16: qty 20

## 2017-12-16 MED ORDER — LIDOCAINE HCL (PF) 1 % IJ SOLN
INTRAMUSCULAR | Status: AC
  Administered 2017-12-16: 0.3 mL
  Filled 2017-12-16: qty 2

## 2017-12-16 NOTE — H&P (Signed)
  Cephas Darby, MD [address]  [place]  [place], [place] [address]  Main: [phone]  Fax: [phone] Pager: [phone]  Primary Care Physician:  Elgie Collard, MD Primary Gastroenterologist:  Dr. Cephas Darby  Pre-Procedure History & Physical: HPI:  Jodi Rice is a 47 y.o. female is here for an colonoscopy.   Past Medical History:  Diagnosis Date  . Diabetes mellitus without complication [facility])     Past Surgical History:  Procedure Laterality Date  . BREAST BIOPSY Right 1980's    Prior to Admission medications   Medication Sig Start Date End Date Taking? Authorizing Provider  sitaGLIPtin-metformin (JANUMET) 50-1000 MG tablet Take 1 tablet by mouth 2 (two) times daily with a meal. 05/30/17  Yes Gregor Hams, MD    Allergies as of 11/26/2017  . (No Known Allergies)    Family History  Problem Relation Age of Onset  . Breast cancer Maternal Aunt 60    Social History   Socioeconomic History  . Marital status: Single    Spouse name: Not on file  . Number of children: Not on file  . Years of education: Not on file  . Highest education level: Not on file  Social Needs  . Financial resource strain: Not on file  . Food insecurity - worry: Not on file  . Food insecurity - inability: Not on file  . Transportation needs - medical: Not on file  . Transportation needs - non-medical: Not on file  Occupational History  . Not on file  Tobacco Use  . Smoking status: Former Research scientist (life sciences)  . Smokeless tobacco: Never Used  Substance and Sexual Activity  . Alcohol use: No  . Drug use: Not on file  . Sexual activity: Not on file  Other Topics Concern  . Not on file  Social History Narrative  . Not on file    Review of Systems: See HPI, otherwise negative ROS  Physical Exam: LMP 12/10/2017 (Exact Date)  General:   Alert,  pleasant and cooperative in NAD Head:  Normocephalic and atraumatic. Neck:  Supple; no masses or thyromegaly. Lungs:  Clear  throughout to auscultation.    Heart:  Regular rate and rhythm. Abdomen:  Soft, nontender and nondistended. Normal bowel sounds, without guarding, and without rebound.   Neurologic:  Alert and  oriented x4;  grossly normal neurologically.  Impression/Plan: Jodi Rice is here for an colonoscopy to be performed for colon cancer screening  Risks, benefits, limitations, and alternatives regarding  colonoscopy have been reviewed with the patient.  Questions have been answered.  All parties agreeable.   Sherri Sear, MD  12/16/2017, 12:11 PM

## 2017-12-16 NOTE — Anesthesia Postprocedure Evaluation (Signed)
Anesthesia Post Note  Patient: Jodi Rice  Procedure(s) Performed: COLONOSCOPY WITH PROPOFOL (N/A )  Patient location during evaluation: Endoscopy Anesthesia Type: General Level of consciousness: awake and alert, oriented and patient cooperative Pain management: satisfactory to patient Vital Signs Assessment: post-procedure vital signs reviewed and stable Respiratory status: spontaneous breathing and respiratory function stable Cardiovascular status: blood pressure returned to baseline and stable Postop Assessment: no headache, no backache, patient able to bend at knees, no apparent nausea or vomiting and adequate PO intake Anesthetic complications: no     Last Vitals:  Vitals:   12/16/17 1216 12/16/17 1359  BP: 115/70 113/74  Pulse: 94 87  Resp: 20 19  Temp: (!) 36 C (!) 36.2 C  SpO2: 100% 100%    Last Pain:  Vitals:   12/16/17 1359  TempSrc: Tympanic                 Ahmani Daoud H Leshay Desaulniers

## 2017-12-16 NOTE — Op Note (Signed)
[facility] Gastroenterology Patient Name: Jodi Rice Procedure Date: 12/16/2017 1:09 PM MRN: [id number] Account #: [id number] Date of Birth: 06-Mar-1971 Admit Type: Outpatient Age: 47 Room: [facility] ENDO ROOM 4 Gender: Female Note Status: Finalized Procedure:            Colonoscopy Indications:          Screening for colorectal malignant neoplasm, This is                        the patient's first colonoscopy Providers:            Lin Landsman MD, MD Referring MD:         Shelby Mattocks. Georga Bora, MD (Referring MD) Medicines:            Monitored Anesthesia Care Complications:        No immediate complications. Estimated blood loss:                        Minimal. Procedure:            Pre-Anesthesia Assessment:                       - Prior to the procedure, a History and Physical was                        performed, and patient medications and allergies were                        reviewed. The patient is competent. The risks and                        benefits of the procedure and the sedation options and                        risks were discussed with the patient. All questions                        were answered and informed consent was obtained.                        Patient identification and proposed procedure were                        verified by the physician, the nurse, the                        anesthesiologist, the anesthetist and the technician in                        the pre-procedure area in the procedure room in the                        endoscopy suite. Mental Status Examination: alert and                        oriented. Airway Examination: normal oropharyngeal                        airway and neck mobility. Respiratory Examination:  clear to auscultation. CV Examination: normal.                        Prophylactic Antibiotics: The patient does not require                        prophylactic antibiotics. Prior  Anticoagulants: The                        patient has taken no previous anticoagulant or                        antiplatelet agents. ASA Grade Assessment: II - A                        patient with mild systemic disease. After reviewing the                        risks and benefits, the patient was deemed in                        satisfactory condition to undergo the procedure. The                        anesthesia plan was to use monitored anesthesia care                        (MAC). Immediately prior to administration of                        medications, the patient was re-assessed for adequacy                        to receive sedatives. The heart rate, respiratory rate,                        oxygen saturations, blood pressure, adequacy of                        pulmonary ventilation, and response to care were                        monitored throughout the procedure. The physical status                        of the patient was re-assessed after the procedure.                       After obtaining informed consent, the colonoscope was                        passed under direct vision. Throughout the procedure,                        the patient's blood pressure, pulse, and oxygen                        saturations were monitored continuously. The                        Colonoscope  was introduced through the anus and                        advanced to the the cecum, identified by appendiceal                        orifice and ileocecal valve. The colonoscopy was                        technically difficult and complex due to significant                        looping. Successful completion of the procedure was                        aided by applying abdominal pressure. The quality of                        the bowel preparation was evaluated using the BBPS                        [facility] Bowel Preparation Scale) with scores of: Right                        Colon = 3, Transverse  Colon = 3 and Left Colon = 3                        (entire mucosa seen well with no residual staining,                        small fragments of stool or opaque liquid). The total                        BBPS score equals 9. The quality of the bowel                        preparation was evaluated using the BBPS [facility] Bowel                        Preparation Scale) with scores of: Right Colon = 2                        (minor amount of residual staining, small fragments of                        stool and/or opaque liquid, but mucosa seen well),                        Transverse Colon = 2 (minor amount of residual                        staining, small fragments of stool and/or opaque                        liquid, but mucosa seen well) and Left Colon = 2 (minor                        amount of residual staining, small fragments  of stool                        and/or opaque liquid, but mucosa seen well). The total                        BBPS score equals 6. The quality of the bowel                        preparation was fair. Findings:      The perianal and digital rectal examinations were normal. Pertinent       negatives include normal sphincter tone and no palpable rectal lesions.      Two sessile polyps were found in the transverse colon and cecum. The       polyps were 2 to 5 mm in size. These polyps were removed with a cold       biopsy forceps. Resection and retrieval were complete.      A 10 mm polyp was found in the sigmoid colon. The polyp was flat.       Preparations were made for mucosal resection. Methylene blue was       injected to raise the lesion. Snare mucosal resection was performed.       Resection was incomplete. The resected tissue was retrieved. Area was       tattooed with an injection of Niger ink.      Non-bleeding internal hemorrhoids were found during retroflexion. The       hemorrhoids were medium-sized. Impression:           - Preparation of the colon was  fair.                       - Two 2 to 5 mm polyps in the transverse colon and in                        the cecum, removed with a cold biopsy forceps. Resected                        and retrieved.                       - One 10 mm polyp in the sigmoid colon, removed with                        mucosal resection. Incomplete resection. Resected                        tissue retrieved.                       - Non-bleeding internal hemorrhoids.                       - Mucosal resection was performed. Resection was                        incomplete. The resected tissue was retrieved. Recommendation:       - Discharge patient to home.                       - Resume previous diet today.                       -  Continue present medications.                       - Await pathology results.                       - Repeat colonoscopy in 3 years or sooner based on                        polyp pathology for surveillance of multiple polyps. Procedure Code(s):    --- Professional ---                       [phone], Colonoscopy, flexible; with endoscopic mucosal                        resection                       45380, 17, Colonoscopy, flexible; with biopsy, single                        or multiple Diagnosis Code(s):    --- Professional ---                       Z12.11, Encounter for screening for malignant neoplasm                        of colon                       K64.8, Other hemorrhoids                       D12.3, Benign neoplasm of transverse colon (hepatic                        flexure or splenic flexure)                       D12.0, Benign neoplasm of cecum                       D12.5, Benign neoplasm of sigmoid colon CPT copyright 2016 American Medical Association. All rights reserved. The codes documented in this report are preliminary and upon coder review may  be revised to meet current compliance requirements. Dr. Ulyess Mort Lin Landsman MD, MD 12/16/2017 2:01:16  PM This report has been signed electronically. Number of Addenda: 0 Note Initiated On: 12/16/2017 1:09 PM Scope Withdrawal Time: 0 hours 32 minutes 48 seconds  Total Procedure Duration: 0 hours 40 minutes 30 seconds       [facility]

## 2017-12-16 NOTE — Anesthesia Preprocedure Evaluation (Signed)
Anesthesia Evaluation  Patient identified by MRN, date of birth, ID band Patient awake    Reviewed: Allergy & Precautions, NPO status , Patient's Chart, lab work & pertinent test results  History of Anesthesia Complications Negative for: history of anesthetic complications  Airway Mallampati: II  TM Distance: >3 FB Neck ROM: Full    Dental  (+) Missing   Pulmonary neg sleep apnea, neg COPD, former smoker,           Cardiovascular Exercise Tolerance: Good (-) hypertension(-) CAD, (-) Past MI and (-) Cardiac Stents      Neuro/Psych negative neurological ROS  negative psych ROS   GI/Hepatic negative GI ROS, Neg liver ROS,   Endo/Other  diabetes, Oral Hypoglycemic Agents  Renal/GU negative Renal ROS     Musculoskeletal negative musculoskeletal ROS (+)   Abdominal (+) - obese,   Peds  Hematology negative hematology ROS (+)   Anesthesia Other Findings Past Medical History: No date: Diabetes mellitus without complication (HCC)   Reproductive/Obstetrics                             Anesthesia Physical Anesthesia Plan  ASA: II  Anesthesia Plan: General   Post-op Pain Management:    Induction: Intravenous  PONV Risk Score and Plan: 2 and Propofol infusion  Airway Management Planned: Natural Airway  Additional Equipment:   Intra-op Plan:   Post-operative Plan:   Informed Consent: I have reviewed the patients History and Physical, chart, labs and discussed the procedure including the risks, benefits and alternatives for the proposed anesthesia with the patient or authorized representative who has indicated his/her understanding and acceptance.   Dental advisory given  Plan Discussed with: CRNA and Anesthesiologist  Anesthesia Plan Comments:         Anesthesia Quick Evaluation

## 2017-12-16 NOTE — Transfer of Care (Signed)
Immediate Anesthesia Transfer of Care Note  Patient: Jodi Rice  Procedure(s) Performed: COLONOSCOPY WITH PROPOFOL (N/A )  Patient Location: PACU  Anesthesia Type:General  Level of Consciousness: awake  Airway & Oxygen Therapy: Patient Spontanous Breathing  Post-op Assessment: Report given to RN, Post -op Vital signs reviewed and stable and Patient moving all extremities X 4  Post vital signs: Reviewed and stable  Last Vitals:  Vitals:   12/16/17 1216 12/16/17 1359  BP: 115/70 113/74  Pulse: 94 87  Resp: 20 19  Temp: (!) 36 C (!) 36.2 C  SpO2: 100% 100%    Last Pain:  Vitals:   12/16/17 1359  TempSrc: Tympanic      Patients Stated Pain Goal: 6 (25/36/64 4034)  Complications: No apparent anesthesia complications

## 2017-12-16 NOTE — Anesthesia Post-op Follow-up Note (Signed)
Anesthesia QCDR form completed.        

## 2017-12-17 ENCOUNTER — Encounter: Payer: Self-pay | Admitting: Gastroenterology

## 2017-12-19 LAB — SURGICAL PATHOLOGY

## 2018-01-08 ENCOUNTER — Telehealth: Payer: Self-pay

## 2018-01-08 ENCOUNTER — Other Ambulatory Visit: Payer: Self-pay

## 2018-01-08 DIAGNOSIS — D125 Benign neoplasm of sigmoid colon: Secondary | ICD-10-CM

## 2018-01-08 NOTE — Telephone Encounter (Signed)
-----   Message from Lin Landsman, MD sent at 12/23/2017 12:11 AM EST ----- Please notify results of polyp pathology.There is no cancer. But, she will need flex sig within next 1-3 months to remove residual polyp from sigmoid colon. Recommend CLD, a bottle of mag citrate, and an enema for prep  Thanks RV

## 2018-01-08 NOTE — Telephone Encounter (Signed)
Pt notified of results and scheduled for a flex sigmoidoscopy at [facility] on 02/26/18. Instructions have been mailed.

## 2018-02-26 ENCOUNTER — Ambulatory Visit
Admission: RE | Admit: 2018-02-26 | Discharge: 2018-02-26 | Disposition: A | Discharge status: Home / Self Care | Payer: Managed Care, Other (non HMO) | Source: Ambulatory Visit | Attending: Gastroenterology | Admitting: Gastroenterology

## 2018-02-26 ENCOUNTER — Encounter
Admission: RE | Disposition: A | Discharge status: Home / Self Care | Payer: Self-pay | Source: Ambulatory Visit | Attending: Gastroenterology

## 2018-02-26 ENCOUNTER — Encounter: Payer: Self-pay | Admitting: *Deleted

## 2018-02-26 DIAGNOSIS — Z538 Procedure and treatment not carried out for other reasons: Secondary | ICD-10-CM | POA: Insufficient documentation

## 2018-02-26 DIAGNOSIS — Z9889 Other specified postprocedural states: Secondary | ICD-10-CM | POA: Insufficient documentation

## 2018-02-26 DIAGNOSIS — D125 Benign neoplasm of sigmoid colon: Secondary | ICD-10-CM

## 2018-02-26 HISTORY — PX: FLEXIBLE SIGMOIDOSCOPY: SHX5431

## 2018-02-26 LAB — GLUCOSE, CAPILLARY: Glucose-Capillary: 247 mg/dL — ABNORMAL HIGH (ref 65–99)

## 2018-02-26 LAB — POCT PREGNANCY, URINE: Preg Test, Ur: NEGATIVE

## 2018-02-26 SURGERY — SIGMOIDOSCOPY, FLEXIBLE
Anesthesia: General

## 2018-02-26 MED ORDER — FENTANYL CITRATE (PF) 100 MCG/2ML IJ SOLN
INTRAMUSCULAR | Status: DC | PRN
  Administered 2018-02-26: 25 ug via INTRAVENOUS
  Administered 2018-02-26: 50 ug via INTRAVENOUS

## 2018-02-26 MED ORDER — MIDAZOLAM HCL 5 MG/5ML IJ SOLN
INTRAMUSCULAR | Status: AC
  Filled 2018-02-26: qty 5

## 2018-02-26 MED ORDER — MIDAZOLAM HCL 5 MG/5ML IJ SOLN
INTRAMUSCULAR | Status: DC | PRN
  Administered 2018-02-26: 2 mg via INTRAVENOUS
  Administered 2018-02-26: 1 mg via INTRAVENOUS

## 2018-02-26 MED ORDER — SODIUM CHLORIDE 0.9 % IV SOLN
INTRAVENOUS | Status: DC
  Administered 2018-02-26: 1000 mL via INTRAVENOUS

## 2018-02-26 MED ORDER — FENTANYL CITRATE (PF) 100 MCG/2ML IJ SOLN
INTRAMUSCULAR | Status: AC
  Filled 2018-02-26: qty 2

## 2018-02-26 NOTE — Op Note (Signed)
[facility] Gastroenterology Patient Name: Jodi Rice Procedure Date: 02/26/2018 8:46 AM MRN: [id number] Account #: [id number] Date of Birth: Aug 12, 1971 Admit Type: Outpatient Age: 47 Room: [facility] ENDO ROOM 2 Gender: Female Note Status: Finalized Procedure:            Flexible Sigmoidoscopy Providers:            Lin Landsman MD, MD Referring MD:         Shelby Mattocks. Georga Bora, MD (Referring MD) Medicines:            Fentanyl 75 micrograms IV, Midazolam 3 mg IV Complications:        No immediate complications. Estimated blood loss: None. Procedure:            Pre-Anesthesia Assessment:                       - Prior to the procedure, a History and Physical was                        performed, and patient medications and allergies were                        reviewed. The patient is competent. The risks and                        benefits of the procedure and the sedation options and                        risks were discussed with the patient. All questions                        were answered and informed consent was obtained.                        Patient identification and proposed procedure were                        verified by the physician, the nurse, the                        anesthesiologist, the anesthetist and the technician in                        the pre-procedure area in the procedure room in the                        endoscopy suite. Mental Status Examination: alert and                        oriented. Airway Examination: normal oropharyngeal                        airway and neck mobility. Respiratory Examination:                        clear to auscultation. CV Examination: normal.                        Prophylactic Antibiotics: The patient does not require  prophylactic antibiotics. Prior Anticoagulants: The                        patient has taken no previous anticoagulant or                        antiplatelet  agents. ASA Grade Assessment: II - A                        patient with mild systemic disease. After reviewing the                        risks and benefits, the patient was deemed in                        satisfactory condition to undergo the procedure. The                        anesthesia plan was to use moderate sedation /                        analgesia (conscious sedation). Immediately prior to                        administration of medications, the patient was                        re-assessed for adequacy to receive sedatives. The                        heart rate, respiratory rate, oxygen saturations, blood                        pressure, adequacy of pulmonary ventilation, and                        response to care were monitored throughout the                        procedure. The physical status of the patient was                        re-assessed after the procedure.                       After obtaining informed consent, the scope was passed                        under direct vision. The Endoscope was introduced                        through the anus The procedure was aborted. The scope                        was not inserted. sigmoid colon Medications were                        rectum. The flexible sigmoidoscopy was aborted due to  poor bowel prep with stool present. The quality of the                        bowel preparation was poor. Findings:      The perianal and digital rectal examinations were normal. Pertinent       negatives include normal sphincter tone and no palpable rectal lesions.      Poor prep, solid stool from rectosigmoid junction      Unable to identify the post polypectomy site Impression:           - The procedure was aborted due to poor bowel prep with                        stool present.                       - Preparation of the colon was poor.                       - No specimens collected. Recommendation:       -  Discharge patient to home.                       - Resume previous diet today.                       - Continue present medications.                       - Repeat colonoscopy in 3 years Procedure Code(s):    --- Professional ---                       [phone], 52, Sigmoidoscopy, flexible; diagnostic,                        including collection of specimen(s) by brushing or                        washing, when performed (separate procedure) Diagnosis Code(s):    --- Professional ---                       Z53.8, Procedure and treatment not carried out for                        other reasons CPT copyright 2017 American Medical Association. All rights reserved. The codes documented in this report are preliminary and upon coder review may  be revised to meet current compliance requirements. Dr. Ulyess Mort Lin Landsman MD, MD 02/26/2018 9:20:26 AM This report has been signed electronically. Number of Addenda: 0 Note Initiated On: 02/26/2018 8:46 AM Total Procedure Duration: 0 hours 2 minutes 38 seconds       [facility]

## 2018-02-26 NOTE — H&P (Signed)
Jodi Darby, MD [address]  [place]  [place], [place] [address]  Main: [phone]  Fax: [phone] Pager: [phone]  Primary Care Physician:  Elgie Collard, MD Primary Gastroenterologist:  Dr. Cephas Rice  Pre-Procedure History & Physical: HPI:  Jodi Rice is a 47 y.o. female is here for an flexible sigmoidoscopy.   Past Medical History:  Diagnosis Date  . Diabetes mellitus without complication [facility])     Past Surgical History:  Procedure Laterality Date  . BREAST BIOPSY Right 1980's  . COLONOSCOPY WITH PROPOFOL N/A 12/16/2017   Procedure: COLONOSCOPY WITH PROPOFOL;  Surgeon: Lin Landsman, MD;  Location: [facility] ENDOSCOPY;  Service: Gastroenterology;  Laterality: N/A;    Prior to Admission medications   Medication Sig Start Date End Date Taking? Authorizing Provider  sitaGLIPtin-metformin (JANUMET) 50-1000 MG tablet Take 1 tablet by mouth 2 (two) times daily with a meal. 05/30/17  Yes Gregor Hams, MD    Allergies as of 01/08/2018  . (No Known Allergies)    Family History  Problem Relation Age of Onset  . Breast cancer Maternal Aunt 60    Social History   Socioeconomic History  . Marital status: Single    Spouse name: Not on file  . Number of children: Not on file  . Years of education: Not on file  . Highest education level: Not on file  Occupational History  . Not on file  Social Needs  . Financial resource strain: Not on file  . Food insecurity:    Worry: Not on file    Inability: Not on file  . Transportation needs:    Medical: Not on file    Non-medical: Not on file  Tobacco Use  . Smoking status: Former Research scientist (life sciences)  . Smokeless tobacco: Never Used  Substance and Sexual Activity  . Alcohol use: No  . Drug use: No  . Sexual activity: Yes  Lifestyle  . Physical activity:    Days per week: Not on file    Minutes per session: Not on file  . Stress: Not on file  Relationships  . Social connections:   Talks on phone: Not on file    Gets together: Not on file    Attends religious service: Not on file    Active member of club or organization: Not on file    Attends meetings of clubs or organizations: Not on file    Relationship status: Not on file  . Intimate partner violence:    Fear of current or ex partner: Not on file    Emotionally abused: Not on file    Physically abused: Not on file    Forced sexual activity: Not on file  Other Topics Concern  . Not on file  Social History Narrative  . Not on file    Review of Systems: See HPI, otherwise negative ROS  Physical Exam: BP (!) 149/86   Pulse 95   Temp (!) 96.8 F (36 C) (Tympanic)   Resp 16   Ht 5\' 8"  (1.727 m)   Wt 184 lb (83.5 kg)   LMP 02/18/2018   SpO2 100%   BMI 27.98 kg/m  General:   Alert,  pleasant and cooperative in NAD Head:  Normocephalic and atraumatic. Neck:  Supple; no masses or thyromegaly. Lungs:  Clear throughout to auscultation.    Heart:  Regular rate and rhythm. Abdomen:  Soft, nontender and nondistended. Normal bowel sounds, without guarding, and without rebound.   Neurologic:  Alert  and  oriented x4;  grossly normal neurologically.  Impression/Plan: Jodi Rice is here for an flexible sigmoidoscopy to be performed for evaluation of post polypectomy site  Risks, benefits, limitations, and alternatives regarding  flexible sigmoidoscopy have been reviewed with the patient.  Questions have been answered.  All parties agreeable.   Sherri Sear, MD  02/26/2018, 8:41 AM

## 2018-03-03 ENCOUNTER — Encounter: Payer: Self-pay | Admitting: Gastroenterology

## 2018-03-10 ENCOUNTER — Encounter: Payer: Self-pay | Admitting: Registered Nurse

## 2018-05-01 ENCOUNTER — Encounter: Payer: Self-pay | Admitting: Emergency Medicine

## 2018-05-01 ENCOUNTER — Other Ambulatory Visit: Payer: Self-pay

## 2018-05-01 ENCOUNTER — Emergency Department
Admission: EM | Admit: 2018-05-01 | Discharge: 2018-05-02 | Disposition: A | Discharge status: Home / Self Care | Payer: Managed Care, Other (non HMO) | Attending: Emergency Medicine | Admitting: Emergency Medicine

## 2018-05-01 DIAGNOSIS — E119 Type 2 diabetes mellitus without complications: Secondary | ICD-10-CM | POA: Diagnosis not present

## 2018-05-01 DIAGNOSIS — R2232 Localized swelling, mass and lump, left upper limb: Secondary | ICD-10-CM | POA: Diagnosis present

## 2018-05-01 DIAGNOSIS — L03319 Cellulitis of trunk, unspecified: Secondary | ICD-10-CM

## 2018-05-01 DIAGNOSIS — Z87891 Personal history of nicotine dependence: Secondary | ICD-10-CM | POA: Diagnosis not present

## 2018-05-01 DIAGNOSIS — L02414 Cutaneous abscess of left upper limb: Secondary | ICD-10-CM | POA: Insufficient documentation

## 2018-05-01 DIAGNOSIS — Z7984 Long term (current) use of oral hypoglycemic drugs: Secondary | ICD-10-CM | POA: Diagnosis not present

## 2018-05-01 DIAGNOSIS — L0291 Cutaneous abscess, unspecified: Secondary | ICD-10-CM

## 2018-05-01 DIAGNOSIS — L02219 Cutaneous abscess of trunk, unspecified: Secondary | ICD-10-CM

## 2018-05-01 MED ORDER — CLINDAMYCIN PHOSPHATE 600 MG/50ML IV SOLN
600.0000 mg | Freq: Once | INTRAVENOUS | Status: AC
  Administered 2018-05-02: 600 mg via INTRAVENOUS
  Filled 2018-05-01: qty 50

## 2018-05-01 NOTE — ED Triage Notes (Signed)
Pt reports abscess to her left shoulder for about 1 week. Draining only a small amount.

## 2018-05-02 ENCOUNTER — Emergency Department: Payer: Managed Care, Other (non HMO)

## 2018-05-02 LAB — BASIC METABOLIC PANEL
Anion gap: 7 (ref 5–15)
BUN: 10 mg/dL (ref 6–20)
CO2: 26 mmol/L (ref 22–32)
CREATININE: 0.5 mg/dL (ref 0.44–1.00)
Calcium: 8.7 mg/dL — ABNORMAL LOW (ref 8.9–10.3)
Chloride: 101 mmol/L (ref 98–111)
GFR calc Af Amer: >60 mL/min (ref >60)
GFR calc non Af Amer: >60 mL/min (ref >60)
GLUCOSE: 360 mg/dL — AB (ref 70–99)
Potassium: 3.9 mmol/L (ref 3.5–5.1)
Sodium: 134 mmol/L — ABNORMAL LOW (ref 135–145)

## 2018-05-02 LAB — CBC
HEMATOCRIT: 34.8 % — AB (ref 35.0–47.0)
Hemoglobin: 12.2 g/dL (ref 12.0–16.0)
MCH: 30.9 pg (ref 26.0–34.0)
MCHC: 35.1 g/dL (ref 32.0–36.0)
MCV: 87.8 fL (ref 80.0–100.0)
Platelets: 415 10*3/uL (ref 150–440)
RBC: 3.96 MIL/uL (ref 3.80–5.20)
RDW: 12.4 % (ref 11.5–14.5)
WBC: 7 10*3/uL (ref 3.6–11.0)

## 2018-05-02 MED ORDER — SODIUM CHLORIDE 0.9 % IV BOLUS
1000.0000 mL | Freq: Once | INTRAVENOUS | Status: AC
  Administered 2018-05-02: 1000 mL via INTRAVENOUS

## 2018-05-02 MED ORDER — KETOROLAC TROMETHAMINE 30 MG/ML IJ SOLN
30.0000 mg | Freq: Once | INTRAMUSCULAR | Status: AC
  Administered 2018-05-02: 30 mg via INTRAVENOUS

## 2018-05-02 MED ORDER — LIDOCAINE-PRILOCAINE 2.5-2.5 % EX CREA
TOPICAL_CREAM | Freq: Once | CUTANEOUS | Status: AC
  Administered 2018-05-02: 02:00:00 via TOPICAL
  Filled 2018-05-02: qty 5

## 2018-05-02 MED ORDER — KETOROLAC TROMETHAMINE 30 MG/ML IJ SOLN
INTRAMUSCULAR | Status: AC
  Filled 2018-05-02: qty 1

## 2018-05-02 MED ORDER — CLINDAMYCIN HCL 300 MG PO CAPS: 300.0000 mg | ORAL_CAPSULE | Freq: Three times a day (TID) | ORAL | 0 refills | Status: AC

## 2018-05-02 MED ORDER — TRAMADOL HCL 50 MG PO TABS: 50.0000 mg | ORAL_TABLET | Freq: Four times a day (QID) | ORAL | 0 refills | Status: AC | PRN

## 2018-05-02 NOTE — Discharge Instructions (Addendum)
Please follow-up with your primary care physician.  Please have your abscess evaluated within the next 2 days to have the packing removed and your abscess reassessed.  Please return with any worsening symptoms, fever, nausea, vomiting or any other concerns.

## 2018-05-02 NOTE — ED Provider Notes (Signed)
[facility] Emergency Department Provider Note   ____________________________________________   First MD Initiated Contact with Patient 05/01/18 2335     (approximate)  I have reviewed the triage vital signs and the nursing notes.   HISTORY  Chief Complaint Abscess    HPI Jodi Rice is a 47 y.o. female who comes into the hospital today with a boil to her left shoulder.  She states that is been there for about a week.  She states that it was a little bump like a blackhead but now it is getting bigger.  It drained a little bit but now it so sore that she cannot stand it.  The patient has not taken anything for the pain.  She rates her pain a 10 out of 10 in intensity.  She states it is currently sore to use her left arm as well.  She is had no nausea, no vomiting, no fevers.  She is here today for evaluation.   Past Medical History:  Diagnosis Date  . Diabetes mellitus without complication [facility])     Patient Active Problem List   Diagnosis Date Noted  . Special screening for malignant neoplasms, colon     Past Surgical History:  Procedure Laterality Date  . BREAST BIOPSY Right 1980's  . COLONOSCOPY WITH PROPOFOL N/A 12/16/2017   Procedure: COLONOSCOPY WITH PROPOFOL;  Surgeon: Lin Landsman, MD;  Location: [place] ENDOSCOPY;  Service: Gastroenterology;  Laterality: N/A;  . FLEXIBLE SIGMOIDOSCOPY N/A 02/26/2018   Procedure: FLEXIBLE SIGMOIDOSCOPY;  Surgeon: Lin Landsman, MD;  Location: [facility] ENDOSCOPY;  Service: Gastroenterology;  Laterality: N/A;    Prior to Admission medications   Medication Sig Start Date End Date Taking? Authorizing Provider  clindamycin (CLEOCIN) 300 MG capsule Take 1 capsule (300 mg total) by mouth 3 (three) times daily for 10 days. 05/02/18 05/12/18  Loney Hering, MD  sitaGLIPtin-metformin (JANUMET) 50-1000 MG tablet Take 1 tablet by mouth 2 (two) times daily with a meal. 05/30/17   Gregor Hams, MD    traMADol (ULTRAM) 50 MG tablet Take 1 tablet (50 mg total) by mouth every 6 (six) hours as needed. 05/02/18   Loney Hering, MD    Allergies Patient has no known allergies.  Family History  Problem Relation Age of Onset  . Breast cancer Maternal Aunt 60    Social History Social History   Tobacco Use  . Smoking status: Former Research scientist (life sciences)  . Smokeless tobacco: Never Used  Substance Use Topics  . Alcohol use: No  . Drug use: No    Review of Systems  Constitutional: No fever/chills Eyes: No visual changes. ENT: No sore throat. Cardiovascular: Denies chest pain. Respiratory: Denies shortness of breath. Gastrointestinal: No abdominal pain.  No nausea, no vomiting.   Genitourinary: Negative for dysuria. Musculoskeletal: Negative for back pain. Skin: skin redness and abscess Neurological: Negative for headaches, focal weakness or numbness.   ____________________________________________   PHYSICAL EXAM:  VITAL SIGNS: ED Triage Vitals  Enc Vitals Group     BP 05/01/18 2243 (!) 151/79     Pulse Rate 05/01/18 2243 93     Resp 05/01/18 2243 18     Temp 05/01/18 2243 98.5 F (36.9 C)     Temp Source 05/01/18 2243 Oral     SpO2 05/01/18 2243 98 %     Weight 05/01/18 2244 184 lb (83.5 kg)     Height 05/01/18 2244 5\' 8"  (1.727 m)     Head Circumference --  Peak Flow --      Pain Score 05/01/18 2244 10     Pain Loc --      Pain Edu? --      Excl. in [place]? --     Constitutional: Alert and oriented. Well appearing and in moderate distress. Eyes: Conjunctivae are normal. PERRL. EOMI. Head: Atraumatic. Nose: No congestion/rhinnorhea. Mouth/Throat: Mucous membranes are moist.  Oropharynx non-erythematous. Cardiovascular: Normal rate, regular rhythm. Grossly normal heart sounds.  Good peripheral circulation. Respiratory: Normal respiratory effort.  No retractions. Lungs CTAB. Gastrointestinal: Soft and nontender. No distention. Positive bowel sounds Musculoskeletal: No  lower extremity tenderness nor edema.   Neurologic:  Normal speech and language.  Skin:  Skin is warm, dry and intact. Soft tissue swelling to left chest wall over the shoulder measuring 6.5x3cm, there is redness over the area. Swelling is firm with some mild fluctuance at the superior aspect of the swelling.  Psychiatric: Mood and affect are normal.   ____________________________________________   LABS (all labs ordered are listed, but only abnormal results are displayed)  Labs Reviewed  CBC - Abnormal; Notable for the following components:      Result Value   HCT 34.8 (*)    All other components within normal limits  BASIC METABOLIC PANEL - Abnormal; Notable for the following components:   Sodium 134 (*)    Glucose, Bld 360 (*)    Calcium 8.7 (*)    All other components within normal limits   ____________________________________________  EKG  none ____________________________________________  RADIOLOGY  ED MD interpretation:  US soft tissue chest: Complex collection on the superficial soft tissues of the left anterior chest wall and shoulder most likely hematoma, clinical correlation and follow-up recommended  Official radiology report(s): [place] Chest Soft Tissue  Result Date: 05/02/2018 CLINICAL DATA:  47 year old female with cellulitis/abscess of the left chest wall/shoulder area. EXAM: LIMITED ULTRASOUND OF SOFT TISSUES TECHNIQUE: Limited ultrasound examination of the soft tissues of the left chest/shoulder was performed in the area of clinical concern. COMPARISON:  None. FINDINGS: There is a 4.3 x 1.4 x 2.9 cm heterogeneous area in the subcutaneous soft tissues of the left shoulder/chest wall corresponding to the area of clinical concern. This collection is predominantly complex fluid with areas of solid tissue. There is vascularity within the solid component of this collection with mild hyperemia and surrounding soft tissues. This most likely represent a hematoma. Superimposed  infection is not excluded. Clinical correlation and follow-up to resolution recommended. IMPRESSION: Complex collection in the superficial soft tissues of the left anterior chest wall and shoulder most likely a hematoma. Clinical correlation and follow-up to resolution recommended. Electronically Signed   By: Anner Crete M.D.   On: 05/02/2018 01:57    ____________________________________________   PROCEDURES  Procedure(s) performed: please, see procedure note(s).  Marland Kitchen.Incision and Drainage Date/Time: 05/02/2018 2:56 AM Performed by: Loney Hering, MD Authorized by: Loney Hering, MD   Consent:    Consent obtained:  Verbal   Consent given by:  Patient   Risks discussed:  Bleeding, infection, incomplete drainage and pain   Alternatives discussed:  Alternative treatment, delayed treatment and observation Location:    Type:  Abscess   Size:  6x3   Location:  Trunk   Trunk location:  Chest Pre-procedure details:    Skin preparation:  Betadine Anesthesia (see MAR for exact dosages):    Anesthesia method:  Topical application   Topical anesthetic:  EMLA cream Procedure type:    Complexity:  Complex  Procedure details:    Incision types:  Stab incision   Incision depth:  Dermal   Scalpel blade:  11   Wound management:  Probed and deloculated   Drainage:  Purulent   Drainage amount:  Moderate   Packing materials:  1/4 in iodoform gauze Post-procedure details:    Patient tolerance of procedure:  Tolerated well, no immediate complications    Critical Care performed: No  ____________________________________________   INITIAL IMPRESSION / ASSESSMENT AND PLAN / ED COURSE  As part of my medical decision making, I reviewed the following data within the electronic MEDICAL RECORD NUMBER Notes from prior ED visits and [place] Controlled Substance Database   This is a 47 year old female who comes into the hospital today with some redness to her left chest wall over her shoulder  and some soft tissue swelling with a concern for an abscess.  The area of redness has some significant induration but may be a small area of fluctuance.  I will send the patient for an ultrasound to determine the size and see if there is enough fluid to drain.  I will also check some blood work on the patient to ensure that she does not have a significantly elevated white blood cell count.  The patient will receive a liter of normal saline, Toradol and some clindamycin.  The patient had a CBC and a BMP.  The patient's glucose was 360.  She will be reassessed once I received her ultrasound.  The patient had an ultrasound which showed a superficial complex collection which is likely infection.  I did perform an incision and drainage of the area.  I also give the patient some medication.  She will be discharged home and encouraged to follow-up in 2 days to have her packing pulled and to have her wound reassessed.  The patient will be discharged home.      ____________________________________________   FINAL CLINICAL IMPRESSION(S) / ED DIAGNOSES  Final diagnoses:  Cellulitis and abscess of trunk  Abscess     ED Discharge Orders        Ordered    clindamycin (CLEOCIN) 300 MG capsule  3 times daily     05/02/18 0311    traMADol (ULTRAM) 50 MG tablet  Every 6 hours PRN     05/02/18 9169       Note:  This document was prepared using Dragon voice recognition software and may include unintentional dictation errors.    Loney Hering, MD 05/02/18 [phone]

## 2018-05-02 NOTE — ED Notes (Signed)
Pt returned to ED Rm 5 from [place] at this time.

## 2018-05-02 NOTE — ED Notes (Signed)
Patient transported to Ultrasound at this time. 

## 2018-06-01 NOTE — Congregational Nurse Program (Signed)
Congregational Nurse Program Note  Date of Encounter: 06/01/2018  Past Medical History: Past Medical History:  Diagnosis Date  . Diabetes mellitus without complication [facility])     Encounter Details: CNP Questionnaire - 06/01/18 1929      Questionnaire   Patient Status  Not Applicable    Race  Black or African American    Location Patient Served At  [employer]    Uninsured  Not Applicable    Food  No food insecurities    Housing/Utilities  Yes, have permanent housing    Transportation  No transportation needs    Interpersonal Safety  Yes, feel physically and emotionally safe where you currently live    Medication  No medication insecurities    Medical Provider  Yes    Referrals  Not Applicable    ED Visit Averted  Not Applicable    Life-Saving Intervention Made  Not Applicable      Client member of [place]. She needed someone to talk to about personal concerns. I was able to sit and talk with her.Marland Kitchen Spiritual guidance and support given. [address] RN BSN CN [facility] PhD. [phone]

## 2018-07-27 ENCOUNTER — Other Ambulatory Visit: Payer: Self-pay | Admitting: Obstetrics and Gynecology

## 2018-07-28 ENCOUNTER — Other Ambulatory Visit: Payer: Self-pay | Admitting: Obstetrics and Gynecology

## 2018-07-28 DIAGNOSIS — R921 Mammographic calcification found on diagnostic imaging of breast: Secondary | ICD-10-CM

## 2018-10-27 ENCOUNTER — Other Ambulatory Visit: Payer: Self-pay | Admitting: Obstetrics and Gynecology

## 2018-10-27 DIAGNOSIS — R921 Mammographic calcification found on diagnostic imaging of breast: Secondary | ICD-10-CM

## 2018-10-28 ENCOUNTER — Telehealth: Payer: Self-pay | Admitting: Gastroenterology

## 2018-10-28 NOTE — Telephone Encounter (Signed)
Patient called an needs to schedule a colonoscopy because she was not cleaned out enough from her last one done on 02-26-18 with Dr Marius Ditch.

## 2018-11-10 NOTE — Telephone Encounter (Signed)
Pt is calling back to schedule procedure

## 2018-11-10 NOTE — Telephone Encounter (Signed)
LVM asking pt to return call for procedure scheduling

## 2018-11-11 ENCOUNTER — Ambulatory Visit
Admission: RE | Admit: 2018-11-11 | Discharge: 2018-11-11 | Disposition: A | Discharge status: Home / Self Care | Payer: Managed Care, Other (non HMO) | Source: Ambulatory Visit | Attending: Obstetrics and Gynecology | Admitting: Obstetrics and Gynecology

## 2018-11-11 ENCOUNTER — Other Ambulatory Visit: Payer: Self-pay

## 2018-11-11 DIAGNOSIS — D125 Benign neoplasm of sigmoid colon: Secondary | ICD-10-CM

## 2018-11-11 DIAGNOSIS — R921 Mammographic calcification found on diagnostic imaging of breast: Secondary | ICD-10-CM

## 2018-11-11 NOTE — Telephone Encounter (Signed)
Flex Sigmoid has been rescheduled per pt to 11/17/2018 at [facility].

## 2018-11-17 ENCOUNTER — Encounter
Admission: RE | Disposition: A | Discharge status: Home / Self Care | Payer: Self-pay | Source: Home / Self Care | Attending: Gastroenterology

## 2018-11-17 ENCOUNTER — Ambulatory Visit
Admission: RE | Admit: 2018-11-17 | Discharge: 2018-11-17 | Disposition: A | Discharge status: Home / Self Care | Payer: Managed Care, Other (non HMO) | Attending: Gastroenterology | Admitting: Gastroenterology

## 2018-11-17 ENCOUNTER — Encounter: Payer: Self-pay | Admitting: *Deleted

## 2018-11-17 ENCOUNTER — Encounter: Payer: Self-pay | Admitting: Anesthesiology

## 2018-11-17 DIAGNOSIS — Z87891 Personal history of nicotine dependence: Secondary | ICD-10-CM | POA: Diagnosis not present

## 2018-11-17 DIAGNOSIS — Z7984 Long term (current) use of oral hypoglycemic drugs: Secondary | ICD-10-CM | POA: Diagnosis not present

## 2018-11-17 DIAGNOSIS — Z09 Encounter for follow-up examination after completed treatment for conditions other than malignant neoplasm: Secondary | ICD-10-CM | POA: Diagnosis not present

## 2018-11-17 DIAGNOSIS — D125 Benign neoplasm of sigmoid colon: Secondary | ICD-10-CM

## 2018-11-17 DIAGNOSIS — E119 Type 2 diabetes mellitus without complications: Secondary | ICD-10-CM | POA: Diagnosis not present

## 2018-11-17 DIAGNOSIS — Z86010 Personal history of colonic polyps: Secondary | ICD-10-CM | POA: Diagnosis not present

## 2018-11-17 HISTORY — PX: FLEXIBLE SIGMOIDOSCOPY: SHX5431

## 2018-11-17 LAB — GLUCOSE, CAPILLARY: GLUCOSE-CAPILLARY: 326 mg/dL — AB (ref 70–99)

## 2018-11-17 SURGERY — SIGMOIDOSCOPY, FLEXIBLE
Anesthesia: General

## 2018-11-17 MED ORDER — SODIUM CHLORIDE 0.9 % IV SOLN: INTRAVENOUS | Status: DC

## 2018-11-17 NOTE — H&P (Signed)
Jodi Darby, MD [address]  [place]  [place], Jodi Rice [address]  Main: [phone]  Fax: [phone] Pager: [phone]  Primary Care Physician:  Jodi Collard, MD Primary Gastroenterologist:  Dr. Cephas Rice  Pre-Procedure History & Physical: HPI:  Jodi Rice is a 48 y.o. female is here for an flexible sigmoidoscopy.   Past Medical History:  Diagnosis Date  . Diabetes mellitus without complication [facility])     Past Surgical History:  Procedure Laterality Date  . BREAST BIOPSY Right 1980's  . COLONOSCOPY WITH PROPOFOL N/A 12/16/2017   Procedure: COLONOSCOPY WITH PROPOFOL;  Surgeon: Jodi Landsman, MD;  Location: [facility] ENDOSCOPY;  Service: Gastroenterology;  Laterality: N/A;  . FLEXIBLE SIGMOIDOSCOPY N/A 02/26/2018   Procedure: FLEXIBLE SIGMOIDOSCOPY;  Surgeon: Jodi Landsman, MD;  Location: [facility] ENDOSCOPY;  Service: Gastroenterology;  Laterality: N/A;    Prior to Admission medications   Medication Sig Start Date End Date Taking? Authorizing Provider  sitaGLIPtin-metformin (JANUMET) 50-1000 MG tablet Take 1 tablet by mouth 2 (two) times daily with a meal. 05/30/17  Yes Jodi Hams, MD  traMADol (ULTRAM) 50 MG tablet Take 1 tablet (50 mg total) by mouth every 6 (six) hours as needed. 05/02/18   Jodi Hering, MD    Allergies as of 11/11/2018  . (No Known Allergies)    Family History  Problem Relation Age of Onset  . Breast cancer Maternal Aunt 60    Social History   Socioeconomic History  . Marital status: Single    Spouse name: Not on file  . Number of children: Not on file  . Years of education: Not on file  . Highest education level: Not on file  Occupational History  . Not on file  Social Needs  . Financial resource strain: Not on file  . Food insecurity:    Worry: Not on file    Inability: Not on file  . Transportation needs:    Medical: Not on file    Non-medical: Not on file  Tobacco Use  . Smoking  status: Former Research scientist (life sciences)  . Smokeless tobacco: Never Used  Substance and Sexual Activity  . Alcohol use: No  . Drug use: No  . Sexual activity: Yes  Lifestyle  . Physical activity:    Days per week: Not on file    Minutes per session: Not on file  . Stress: Not on file  Relationships  . Social connections:    Talks on phone: Not on file    Gets together: Not on file    Attends religious service: Not on file    Active member of club or organization: Not on file    Attends meetings of clubs or organizations: Not on file    Relationship status: Not on file  . Intimate partner violence:    Fear of current or ex partner: Not on file    Emotionally abused: Not on file    Physically abused: Not on file    Forced sexual activity: Not on file  Other Topics Concern  . Not on file  Social History Narrative  . Not on file    Review of Systems: See HPI, otherwise negative ROS  Physical Exam: BP (!) 142/84   Pulse 94   Temp (!) 96.3 F (35.7 C) (Tympanic)   Resp 20   Ht 5\' 8"  (1.727 m)   Wt 83.5 kg   LMP 11/06/2018   SpO2 100%   BMI 27.98 kg/m  General:  Alert,  pleasant and cooperative in NAD Head:  Normocephalic and atraumatic. Neck:  Supple; no masses or thyromegaly. Lungs:  Clear throughout to auscultation.    Heart:  Regular rate and rhythm. Abdomen:  Soft, nontender and nondistended. Normal bowel sounds, without guarding, and without rebound.   Neurologic:  Alert and  oriented x4;  grossly normal neurologically.  Impression/Plan: Jodi Rice is here for an flexible sigmoidoscopy to be performed to review polypectomy site  Risks, benefits, limitations, and alternatives regarding  flexible sigmoidoscopy have been reviewed with the patient.  Questions have been answered.  All parties agreeable.   Jodi Sear, MD  11/17/2018, 3:53 PM

## 2018-11-17 NOTE — Op Note (Signed)
[facility] Gastroenterology Patient Name: Jodi Rice Procedure Date: 11/17/2018 3:12 PM MRN: [id number] Account #: [id number] Date of Birth: 1971/08/02 Admit Type: Outpatient Age: 48 Room: [facility] ENDO ROOM 4 Gender: Female Note Status: Finalized Procedure:            Flexible Sigmoidoscopy Indications:          Surveillance: History of piecemeal removal adenoma on                        last colonoscopy (< 3 yrs) Providers:            Lin Landsman MD, MD Referring MD:         Shelby Mattocks. Georga Bora, MD (Referring MD) Medicines:            None Complications:        No immediate complications. Estimated blood loss: None. Procedure:            Pre-Anesthesia Assessment:                       - Prior to the procedure, no anesthesia or sedation was                        planned.                       After obtaining informed consent, the scope was passed                        under direct vision. The Endoscope was introduced                        through the anus and advanced to the the descending                        colon. The flexible sigmoidoscopy was accomplished                        without difficulty. The patient tolerated the procedure                        well. The quality of the bowel preparation was adequate. Findings:      A tattoo was seen at 40 cm proximal to the anus. A post-polypectomy scar       was found at the tattoo site. There was no evidence of residual polyp       tissue.      The perianal and digital rectal examinations were normal. Pertinent       negatives include normal sphincter tone and no palpable rectal lesions. Impression:           - A tattoo was seen at 40 cm proximal to the anus. A                        post-polypectomy scar was found at the tattoo site.                        There was no evidence of residual polyp tissue.                       - No specimens collected.  Recommendation:       - Continue present  medications.                       - Discharge patient to home.                       - Resume previous diet today.                       - Perform a surveillance colonoscopy in 3 years. Procedure Code(s):    --- Professional ---                       [phone], Sigmoidoscopy, flexible; diagnostic, including                        collection of specimen(s) by brushing or washing, when                        performed (separate procedure) Diagnosis Code(s):    --- Professional ---                       Z86.010, Personal history of colonic polyps CPT copyright 2018 American Medical Association. All rights reserved. The codes documented in this report are preliminary and upon coder review may  be revised to meet current compliance requirements. Dr. Ulyess Mort Lin Landsman MD, MD 11/17/2018 3:33:45 PM This report has been signed electronically. Number of Addenda: 0 Note Initiated On: 11/17/2018 3:12 PM Total Procedure Duration: 0 hours 13 minutes 32 seconds       [facility]

## 2018-11-18 ENCOUNTER — Encounter: Payer: Self-pay | Admitting: Gastroenterology

## 2018-12-31 LAB — BASIC METABOLIC PANEL
BUN: 8 (ref 4–21)
Creatinine: 0.5 (ref 0.5–1.1)
Glucose: 411
Potassium: 4.3 (ref 3.4–5.3)
Sodium: 133 — AB (ref 137–147)

## 2018-12-31 LAB — LIPID PANEL
Cholesterol: 149 (ref 0–200)
HDL: 66 (ref 35–70)
LDL Cholesterol: 61
LDl/HDL Ratio: 2.3
Triglycerides: 111 (ref 40–160)

## 2018-12-31 LAB — VITAMIN D 25 HYDROXY (VIT D DEFICIENCY, FRACTURES): Vit D, 25-Hydroxy: 20.2

## 2018-12-31 LAB — HEPATIC FUNCTION PANEL
ALT: 13 (ref 7–35)
AST: 12 — AB (ref 13–35)
Alkaline Phosphatase: 71 (ref 25–125)
Bilirubin, Total: 1.3

## 2019-03-25 DIAGNOSIS — I10 Essential (primary) hypertension: Secondary | ICD-10-CM | POA: Diagnosis not present

## 2019-04-16 DIAGNOSIS — Z20828 Contact with and (suspected) exposure to other viral communicable diseases: Secondary | ICD-10-CM | POA: Diagnosis not present

## 2019-05-19 DIAGNOSIS — E1140 Type 2 diabetes mellitus with diabetic neuropathy, unspecified: Secondary | ICD-10-CM | POA: Diagnosis not present

## 2019-06-07 DIAGNOSIS — E1129 Type 2 diabetes mellitus with other diabetic kidney complication: Secondary | ICD-10-CM | POA: Diagnosis not present

## 2019-06-07 DIAGNOSIS — E1159 Type 2 diabetes mellitus with other circulatory complications: Secondary | ICD-10-CM | POA: Diagnosis not present

## 2019-06-07 DIAGNOSIS — E1142 Type 2 diabetes mellitus with diabetic polyneuropathy: Secondary | ICD-10-CM | POA: Diagnosis not present

## 2019-06-07 DIAGNOSIS — E1165 Type 2 diabetes mellitus with hyperglycemia: Secondary | ICD-10-CM | POA: Diagnosis not present

## 2019-06-07 LAB — HEMOGLOBIN A1C: Hemoglobin A1C: 10.7

## 2019-06-16 DIAGNOSIS — M7062 Trochanteric bursitis, left hip: Secondary | ICD-10-CM | POA: Diagnosis not present

## 2019-06-16 DIAGNOSIS — M25552 Pain in left hip: Secondary | ICD-10-CM | POA: Diagnosis not present

## 2019-06-16 DIAGNOSIS — G8929 Other chronic pain: Secondary | ICD-10-CM | POA: Diagnosis not present

## 2019-06-16 DIAGNOSIS — M1612 Unilateral primary osteoarthritis, left hip: Secondary | ICD-10-CM | POA: Diagnosis not present

## 2019-06-18 ENCOUNTER — Other Ambulatory Visit: Payer: Self-pay

## 2019-06-18 ENCOUNTER — Encounter: Payer: Self-pay | Admitting: Podiatry

## 2019-06-18 ENCOUNTER — Other Ambulatory Visit: Payer: Self-pay | Admitting: Podiatry

## 2019-06-18 ENCOUNTER — Ambulatory Visit (INDEPENDENT_AMBULATORY_CARE_PROVIDER_SITE_OTHER): Payer: 59 | Admitting: Podiatry

## 2019-06-18 ENCOUNTER — Ambulatory Visit (INDEPENDENT_AMBULATORY_CARE_PROVIDER_SITE_OTHER): Payer: 59

## 2019-06-18 VITALS — BP 174/101 | HR 90

## 2019-06-18 DIAGNOSIS — E0842 Diabetes mellitus due to underlying condition with diabetic polyneuropathy: Secondary | ICD-10-CM | POA: Diagnosis not present

## 2019-06-18 DIAGNOSIS — G629 Polyneuropathy, unspecified: Secondary | ICD-10-CM

## 2019-06-18 MED ORDER — PREGABALIN 150 MG PO CAPS: 150.0000 mg | ORAL_CAPSULE | Freq: Two times a day (BID) | ORAL | 1 refills | Status: AC

## 2019-06-21 NOTE — Progress Notes (Signed)
   Subjective: 48 y.o. female with PMHx of T2DM presenting today as a new patient with a chief complaint of constant bilateral foot pain that has been ongoing for the past several months. She reports associated numbness, tingling and burning of the feet. She reports being diagnosed with neuropathy in the past. She has not had any treatment for her symptoms. She denies any modifying factors. Patient is here for further evaluation and treatment.   Past Medical History:  Diagnosis Date  . Diabetes mellitus without complication ([place])      Objective:  Physical Exam General: Alert and oriented x3 in no acute distress  Dermatology: Hyperkeratotic lesion(s) present on the bilateral feet x 2. Pain on palpation with a central nucleated core noted.  Skin is warm, dry and supple bilateral lower extremities. Negative for open lesions or macerations.  Vascular: Palpable pedal pulses bilaterally. No edema or erythema noted. Capillary refill within normal limits.  Neurological: Epicritic and protective threshold diminished bilaterally.   Musculoskeletal Exam: Pain on palpation at the keratotic lesion(s) noted. Range of motion within normal limits bilateral. Muscle strength 5/5 in all groups bilateral.  Assessment: #1 Diabetes mellitus w/ peripheral neuropathy #2 Pre-ulcerative callus lesions noted to the bilateral feet x 2  Plan of Care:  #1 Patient evaluated #2 Appointment with Liliane Channel, Pedorthist, for custom molded orthotics.  #3 Return to clinic as needed.    Edrick Kins, DPM [facility]  Dr. Edrick Kins, [place]                                        [place], [place] [address]                Office [phone]  Fax [phone]

## 2019-06-30 ENCOUNTER — Ambulatory Visit: Payer: Managed Care, Other (non HMO) | Admitting: Family Medicine

## 2019-07-05 DIAGNOSIS — E113512 Type 2 diabetes mellitus with proliferative diabetic retinopathy with macular edema, left eye: Secondary | ICD-10-CM | POA: Diagnosis not present

## 2019-07-07 ENCOUNTER — Other Ambulatory Visit: Payer: Self-pay

## 2019-07-07 ENCOUNTER — Ambulatory Visit: Payer: 59 | Admitting: Orthotics

## 2019-07-07 DIAGNOSIS — E0842 Diabetes mellitus due to underlying condition with diabetic polyneuropathy: Secondary | ICD-10-CM

## 2019-07-07 DIAGNOSIS — G629 Polyneuropathy, unspecified: Secondary | ICD-10-CM

## 2019-07-07 DIAGNOSIS — M2041 Other hammer toe(s) (acquired), right foot: Secondary | ICD-10-CM

## 2019-07-07 NOTE — Progress Notes (Addendum)
Patient was measured for med necessity extra depth shoes (x2) w/ 3 pair (x6) custom f/o. Patient was measured w/ brannock to determine size and width.  Foam impression mold was achieved and deemed appropriate for fabrication of  cmfo.   See DPM chart notes for further documentation and dx codes for determination of medical necessity.  Appropriate forms will be sent to PCP to verify and sign off on medical necessity.    FOLLOW UP:  Cannot order shoes as she is being seen by NP; can't leave voice mail as it is full.   Tried calling twice, no answer.

## 2019-07-08 ENCOUNTER — Telehealth: Payer: Self-pay | Admitting: Orthotics

## 2019-07-08 NOTE — Telephone Encounter (Signed)
Tried to call patient; voicemail full.  She reported in office that she was being seen by new doctor at [facility]; hower Warnell Forester is a NP, not MD/DO.     Unable to fill DBS request and unable to get in touch with patient.  Voice mail full

## 2019-07-12 ENCOUNTER — Ambulatory Visit (INDEPENDENT_AMBULATORY_CARE_PROVIDER_SITE_OTHER): Payer: 59 | Admitting: Family Medicine

## 2019-07-12 ENCOUNTER — Encounter: Payer: Self-pay | Admitting: Family Medicine

## 2019-07-12 ENCOUNTER — Other Ambulatory Visit: Payer: Self-pay

## 2019-07-12 VITALS — BP 94/62 | HR 96 | Temp 98.1°F | Ht 66.75 in | Wt 170.5 lb

## 2019-07-12 DIAGNOSIS — R413 Other amnesia: Secondary | ICD-10-CM

## 2019-07-12 DIAGNOSIS — Z2821 Immunization not carried out because of patient refusal: Secondary | ICD-10-CM

## 2019-07-12 DIAGNOSIS — E1140 Type 2 diabetes mellitus with diabetic neuropathy, unspecified: Secondary | ICD-10-CM | POA: Diagnosis not present

## 2019-07-12 DIAGNOSIS — M25552 Pain in left hip: Secondary | ICD-10-CM | POA: Diagnosis not present

## 2019-07-12 MED ORDER — METFORMIN HCL ER 500 MG PO TB24: 500.0000 mg | ORAL_TABLET | Freq: Every day | ORAL | 1 refills | Status: AC

## 2019-07-12 NOTE — Patient Instructions (Addendum)
Good to see you today  Look back over your [facility] lifestyle information. Increase your lean proteins- fish, shellfish, beans, tofu, greek yogurt. Eat lots of non starchy vegetables. Eat lower sugar fruits like strawberries, blueberries, raspberries, apples, peaches, pears, small handful grapes. Avoid desserts.   Please check your blood sugars at least a couple of times a week.   Your memory test was pretty good. Please follow up with neurology, Dr. Melrose Nakayama, for your neuropathy and memory concerns. I think it is more likely related to your diabetes, stress and not enough sleep.   I have put in a referral for physical therapy, you should get a call in the next 5-10 days  Follow up in 6 months for complete physical

## 2019-07-12 NOTE — Progress Notes (Signed)
Subjective:    Patient ID: Jodi Rice, female    DOB: 26-Jan-1971, 48 y.o.   MRN: [id number]  HPI Chief Complaint  Patient presents with  . Establish Care    was see Dr Georga Bora  . memory issues    x 3 months. Can not remember conversations, or where she put things, etc.  . Joint Pain    left hip and left leg pain, symptoms x 2 months. Numbness in feet     This is a 48 yo female who presents today to establish care. She works for [employer] care, goes to SNF/home visits.   Last CPE- unsure Mammo- 11/11/2018 Pap- unsure, will schedule CPE Colonoscopy- 12/16/2017 Tdap- unsure, will think about Flu- doesn't get, not required by work Eye- over 1 year ago, has upcoming appointment Exercise- no regular  DM type 2- sees endocrine, last visit 06/07/2019, hgba1c 10.7%. She was to be on jardiance 25 mg daily, Januvia 100 mg daily, metformin 500 mg daily. LDL was at goal off lipitor, plan for monitoring. BS running 250s. She is currently taking jardiance 25, januvamet. ? Doses. Diet- no meat, eats seafood. Doesn't always eat breakfast. If she does, she will eat a half a sandwich, drinks iced coffee (sugar free). Lunch- eats out a lot, usually fried fish, collard greens or fried okra. Dinner- hibachi vegetables. Rarely cooks. Snacks- cookies, cake, chips. Drinks diet soda.  Weight up and down. Urinating frequently.   Vit D deficiency- she is taking weekly supplementation and endocrine will recheck.   Painful diabetic neuropathy- sees Dr. Melrose Nakayama, last visit 05/19/2019. Did not follow up as recommended.   Difficulty with memory- has noticed over last several months. Forgets where she put things and conversations with people. She can not remember her medications today when looking at a printed list.   Left hip pain- recently saw orthopedics (06/16/2019), Dr. Candelaria Stagers. Was dx with chronic left hip pain, trochanteric bursitis, primary osteoarthritis. In reviewing the note, she had an  ultrasound guided injection. She did not mention this. She has been taking ibuprofen with little relief. Some relief with hot shower. Worse with standing and walking.   Past Medical History:  Diagnosis Date  . Diabetes mellitus without complication [facility])    Past Surgical History:  Procedure Laterality Date  . BREAST BIOPSY Right 1980's  . COLONOSCOPY WITH PROPOFOL N/A 12/16/2017   Procedure: COLONOSCOPY WITH PROPOFOL;  Surgeon: Lin Landsman, MD;  Location: [facility] ENDOSCOPY;  Service: Gastroenterology;  Laterality: N/A;  . FLEXIBLE SIGMOIDOSCOPY N/A 02/26/2018   Procedure: FLEXIBLE SIGMOIDOSCOPY;  Surgeon: Lin Landsman, MD;  Location: [facility] ENDOSCOPY;  Service: Gastroenterology;  Laterality: N/A;  . FLEXIBLE SIGMOIDOSCOPY N/A 11/17/2018   Procedure: FLEXIBLE SIGMOIDOSCOPY;  Surgeon: Lin Landsman, MD;  Location: [facility] ENDOSCOPY;  Service: Gastroenterology;  Laterality: N/A;  . TIBIA FRACTURE SURGERY     both legs fractured.    Family History  Problem Relation Age of Onset  . Breast cancer Maternal Aunt 60  . Diabetes Mother   . Diabetes Sister   . Hypertension Sister    Social History   Tobacco Use  . Smoking status: Former Smoker    Packs/day: 1.00    Years: 2.00    Pack years: 2.00    Types: Cigarettes    Quit date: 10/21/1986    Years since quitting: 32.7  . Smokeless tobacco: Never Used  Substance Use Topics  . Alcohol use: No  . Drug use: No     Review  of Systems Per HPI    Objective:   Physical Exam Vitals signs reviewed.  Constitutional:      General: She is not in acute distress.    Appearance: Normal appearance. She is normal weight. She is not ill-appearing, toxic-appearing or diaphoretic.  HENT:     Head: Normocephalic and atraumatic.  Eyes:     Conjunctiva/sclera: Conjunctivae normal.  Neck:     Musculoskeletal: Normal range of motion. No neck rigidity or muscular tenderness.  Cardiovascular:     Rate and Rhythm: Normal rate.  Pulmonary:      Effort: Pulmonary effort is normal.  Musculoskeletal:     Left hip: She exhibits decreased range of motion (very tight hamstrings, hip flexors. ). She exhibits normal strength, no tenderness and no bony tenderness.     Right lower leg: No edema.     Left lower leg: No edema.     Comments: Mildly ataxic gait when first starting walking.   Lymphadenopathy:     Cervical: No cervical adenopathy.  Skin:    General: Skin is warm and dry.  Neurological:     Mental Status: She is alert and oriented to person, place, and time.  Psychiatric:        Mood and Affect: Mood normal.        Behavior: Behavior normal.        Thought Content: Thought content normal.        Judgment: Judgment normal.          BP 94/62   Pulse 96   Temp 98.1 F (36.7 C)   Ht 5' 6.75" (1.695 m)   Wt 170 lb 8 oz (77.3 kg)   LMP 06/28/2019   SpO2 98%   BMI 26.90 kg/m  Wt Readings from Last 3 Encounters:  07/12/19 170 lb 8 oz (77.3 kg)  11/17/18 184 lb (83.5 kg)  05/01/18 184 lb (83.5 kg)   MMSE - Mini Mental State Exam 07/12/2019  Orientation to time 5  Orientation to Place 5  Registration 3  Attention/ Calculation 5  Recall 1  Language- name 2 objects 2  Language- repeat 1  Language- follow 3 step command 3  Language- read & follow direction 1  Write a sentence 1  Copy design 1  Total score 28     Assessment & Plan:  1. Left hip pain - recent visit with ortho and injection, discussed PT and patient is agreeable.  - Ambulatory referral to Physical Therapy  2. Type 2 diabetes mellitus with diabetic neuropathy, without long-term current use of insulin ([place]) - She has very little insight into her diagnosis and how her choices affect her blood sugar and overall health - discussed her diet and medication. She does not wish to have further nutrition education at this time. I encouraged her to review her materials from previous classes.  - follow up with endocrine as scheduled in 2 months -  encouraged her to check her blood sugars regularly - metFORMIN (GLUCOPHAGE XR) 500 MG 24 hr tablet; Take 1 tablet (500 mg total) by mouth daily with breakfast.  Dispense: 90 tablet; Refill: 1 - she reports that she has upcoming eye exam with [facility]  3. Memory difficulties - likely multifactorial, she scored 28/30 on MMSE - she is under a lot of stress, not sleeping well with nocturia - she has neurologist and is due follow up, I encouraged her to schedule follow up and discuss these issues with him  4. Influenza  vaccine refused  - follow up in 6 months for CPE  Clarene Reamer, FNP-BC  [facility], [place]  07/12/2019 4:33 PM

## 2019-07-19 DIAGNOSIS — E113511 Type 2 diabetes mellitus with proliferative diabetic retinopathy with macular edema, right eye: Secondary | ICD-10-CM | POA: Diagnosis not present

## 2019-07-28 DIAGNOSIS — E1140 Type 2 diabetes mellitus with diabetic neuropathy, unspecified: Secondary | ICD-10-CM | POA: Diagnosis not present

## 2019-08-04 DIAGNOSIS — Z8781 Personal history of (healed) traumatic fracture: Secondary | ICD-10-CM | POA: Diagnosis not present

## 2019-08-04 DIAGNOSIS — W548XXA Other contact with dog, initial encounter: Secondary | ICD-10-CM | POA: Diagnosis not present

## 2019-08-04 DIAGNOSIS — M25562 Pain in left knee: Secondary | ICD-10-CM | POA: Diagnosis not present

## 2019-08-11 ENCOUNTER — Telehealth: Payer: Self-pay | Admitting: Podiatry

## 2019-08-11 NOTE — Telephone Encounter (Signed)
Called pt after checking benefits for diabetic shoes and inserts and orthotics and they are valid and billable for all 3 codes.Covered @ 80% after 1500.00 deductible.(pt has met 0.00) Pt was upset and does not understand why she was not told this before. I explained that Liliane Channel thought pt had [employer] as primary which is why he said she needed to see an md/do to get the shoes signed off on. I apologized and said he was mistaken and I checked benefits because I wanted to make pt aware prior to [place] ordering them and her getting a bill for them and being even more upset. She has decided she is not wanting to proceed because she thinks it is too much.  I did also explain that we could orthotics and the cost is 398.00.

## 2019-08-11 NOTE — Telephone Encounter (Signed)
Pt called checking on status of diabetic shoes she ordered last month.  Upon checking the shoes were not ordered because Liliane Channel called and pt is seen by a NP/Pa and she needed to see the md/do. Liliane Channel tried calling a couple of times but voicemail was full.  I checked and pt does not have medicare she has regular [employer] and I explained that I would check benefits before ordering to make sure they would be covered and let her know. If not covered for 1 pr shoes and 1 pr of inserts it is 250.00

## 2019-08-15 ENCOUNTER — Encounter: Payer: Self-pay | Admitting: Family Medicine

## 2019-08-15 DIAGNOSIS — E1121 Type 2 diabetes mellitus with diabetic nephropathy: Secondary | ICD-10-CM | POA: Insufficient documentation

## 2019-10-07 ENCOUNTER — Ambulatory Visit (INDEPENDENT_AMBULATORY_CARE_PROVIDER_SITE_OTHER): Payer: 59 | Admitting: Psychology

## 2019-10-07 DIAGNOSIS — R69 Illness, unspecified: Secondary | ICD-10-CM | POA: Diagnosis not present

## 2019-10-07 DIAGNOSIS — F331 Major depressive disorder, recurrent, moderate: Secondary | ICD-10-CM | POA: Diagnosis not present

## 2019-10-11 ENCOUNTER — Ambulatory Visit: Payer: 59 | Attending: Internal Medicine

## 2019-10-11 DIAGNOSIS — Z20828 Contact with and (suspected) exposure to other viral communicable diseases: Secondary | ICD-10-CM | POA: Diagnosis not present

## 2019-10-11 DIAGNOSIS — Z20822 Contact with and (suspected) exposure to covid-19: Secondary | ICD-10-CM

## 2019-10-12 LAB — NOVEL CORONAVIRUS, NAA: SARS-CoV-2, NAA: DETECTED — AB

## 2019-10-13 ENCOUNTER — Ambulatory Visit: Payer: Self-pay

## 2019-10-13 NOTE — Telephone Encounter (Signed)
Provided covid-19 lab results.  To Patient voiced understanding.  Voiced understanding.  Provided care advice.  Reviewed isolation  Protocol Voiced  Understanding.  Reviewed protocol to come of of isolation.  Patient voiced understanding.

## 2019-10-14 ENCOUNTER — Encounter: Payer: Self-pay | Admitting: Emergency Medicine

## 2019-10-14 ENCOUNTER — Emergency Department
Admission: EM | Admit: 2019-10-14 | Discharge: 2019-10-14 | Disposition: A | Discharge status: Home / Self Care | Payer: 59 | Source: Home / Self Care | Attending: Emergency Medicine | Admitting: Emergency Medicine

## 2019-10-14 ENCOUNTER — Other Ambulatory Visit: Payer: Self-pay

## 2019-10-14 DIAGNOSIS — R112 Nausea with vomiting, unspecified: Secondary | ICD-10-CM | POA: Diagnosis not present

## 2019-10-14 DIAGNOSIS — R0602 Shortness of breath: Secondary | ICD-10-CM | POA: Diagnosis not present

## 2019-10-14 DIAGNOSIS — J159 Unspecified bacterial pneumonia: Secondary | ICD-10-CM | POA: Diagnosis not present

## 2019-10-14 DIAGNOSIS — Z452 Encounter for adjustment and management of vascular access device: Secondary | ICD-10-CM | POA: Diagnosis not present

## 2019-10-14 DIAGNOSIS — E1121 Type 2 diabetes mellitus with diabetic nephropathy: Secondary | ICD-10-CM | POA: Diagnosis not present

## 2019-10-14 DIAGNOSIS — G4710 Hypersomnia, unspecified: Secondary | ICD-10-CM | POA: Diagnosis not present

## 2019-10-14 DIAGNOSIS — E0810 Diabetes mellitus due to underlying condition with ketoacidosis without coma: Secondary | ICD-10-CM | POA: Diagnosis not present

## 2019-10-14 DIAGNOSIS — R531 Weakness: Secondary | ICD-10-CM | POA: Diagnosis not present

## 2019-10-14 DIAGNOSIS — J9601 Acute respiratory failure with hypoxia: Secondary | ICD-10-CM | POA: Diagnosis not present

## 2019-10-14 DIAGNOSIS — R918 Other nonspecific abnormal finding of lung field: Secondary | ICD-10-CM | POA: Diagnosis not present

## 2019-10-14 DIAGNOSIS — E1110 Type 2 diabetes mellitus with ketoacidosis without coma: Secondary | ICD-10-CM | POA: Diagnosis not present

## 2019-10-14 DIAGNOSIS — J22 Unspecified acute lower respiratory infection: Secondary | ICD-10-CM | POA: Diagnosis not present

## 2019-10-14 DIAGNOSIS — Z79899 Other long term (current) drug therapy: Secondary | ICD-10-CM | POA: Insufficient documentation

## 2019-10-14 DIAGNOSIS — Z7984 Long term (current) use of oral hypoglycemic drugs: Secondary | ICD-10-CM | POA: Insufficient documentation

## 2019-10-14 DIAGNOSIS — E860 Dehydration: Secondary | ICD-10-CM | POA: Insufficient documentation

## 2019-10-14 DIAGNOSIS — U071 COVID-19: Secondary | ICD-10-CM

## 2019-10-14 DIAGNOSIS — R1084 Generalized abdominal pain: Secondary | ICD-10-CM | POA: Diagnosis not present

## 2019-10-14 DIAGNOSIS — N179 Acute kidney failure, unspecified: Secondary | ICD-10-CM | POA: Diagnosis not present

## 2019-10-14 DIAGNOSIS — E1165 Type 2 diabetes mellitus with hyperglycemia: Secondary | ICD-10-CM | POA: Diagnosis not present

## 2019-10-14 DIAGNOSIS — E1010 Type 1 diabetes mellitus with ketoacidosis without coma: Secondary | ICD-10-CM | POA: Diagnosis not present

## 2019-10-14 DIAGNOSIS — R000 Tachycardia, unspecified: Secondary | ICD-10-CM | POA: Diagnosis not present

## 2019-10-14 DIAGNOSIS — Z87891 Personal history of nicotine dependence: Secondary | ICD-10-CM | POA: Insufficient documentation

## 2019-10-14 DIAGNOSIS — Z7982 Long term (current) use of aspirin: Secondary | ICD-10-CM | POA: Insufficient documentation

## 2019-10-14 DIAGNOSIS — G4722 Circadian rhythm sleep disorder, advanced sleep phase type: Secondary | ICD-10-CM | POA: Diagnosis not present

## 2019-10-14 DIAGNOSIS — R0902 Hypoxemia: Secondary | ICD-10-CM | POA: Diagnosis not present

## 2019-10-14 DIAGNOSIS — E119 Type 2 diabetes mellitus without complications: Secondary | ICD-10-CM | POA: Insufficient documentation

## 2019-10-14 DIAGNOSIS — A419 Sepsis, unspecified organism: Secondary | ICD-10-CM | POA: Diagnosis not present

## 2019-10-14 DIAGNOSIS — J1289 Other viral pneumonia: Secondary | ICD-10-CM | POA: Diagnosis not present

## 2019-10-14 DIAGNOSIS — E872 Acidosis: Secondary | ICD-10-CM | POA: Diagnosis not present

## 2019-10-14 DIAGNOSIS — R52 Pain, unspecified: Secondary | ICD-10-CM | POA: Diagnosis not present

## 2019-10-14 LAB — COMPREHENSIVE METABOLIC PANEL
ALT: 14 U/L (ref 0–44)
AST: 23 U/L (ref 15–41)
Albumin: 3 g/dL — ABNORMAL LOW (ref 3.5–5.0)
Alkaline Phosphatase: 74 U/L (ref 38–126)
Anion gap: 23 — ABNORMAL HIGH (ref 5–15)
BUN: 16 mg/dL (ref 6–20)
CO2: 14 mmol/L — ABNORMAL LOW (ref 22–32)
Calcium: 8.3 mg/dL — ABNORMAL LOW (ref 8.9–10.3)
Chloride: 107 mmol/L (ref 98–111)
Creatinine, Ser: 0.87 mg/dL (ref 0.44–1.00)
GFR calc Af Amer: >60 mL/min (ref >60)
GFR calc non Af Amer: >60 mL/min (ref >60)
Glucose, Bld: 154 mg/dL — ABNORMAL HIGH (ref 70–99)
Potassium: 4.2 mmol/L (ref 3.5–5.1)
Sodium: 144 mmol/L (ref 135–145)
Total Bilirubin: 2 mg/dL — ABNORMAL HIGH (ref 0.3–1.2)
Total Protein: 7.3 g/dL (ref 6.5–8.1)

## 2019-10-14 LAB — LIPASE, BLOOD: Lipase: 14 U/L (ref 11–51)

## 2019-10-14 LAB — CBC
HCT: 42 % (ref 36.0–46.0)
Hemoglobin: 13.9 g/dL (ref 12.0–15.0)
MCH: 29.1 pg (ref 26.0–34.0)
MCHC: 33.1 g/dL (ref 30.0–36.0)
MCV: 88.1 fL (ref 80.0–100.0)
Platelets: 358 10*3/uL (ref 150–400)
RBC: 4.77 MIL/uL (ref 3.87–5.11)
RDW: 13.4 % (ref 11.5–15.5)
WBC: 9.3 10*3/uL (ref 4.0–10.5)
nRBC: 0 % (ref 0.0–0.2)

## 2019-10-14 MED ORDER — ONDANSETRON 8 MG PO TBDP: 8.0000 mg | ORAL_TABLET | Freq: Three times a day (TID) | ORAL | 0 refills | Status: AC | PRN

## 2019-10-14 MED ORDER — SODIUM CHLORIDE 0.9% FLUSH
3.0000 mL | Freq: Once | INTRAVENOUS | Status: AC
  Administered 2019-10-14: 3 mL via INTRAVENOUS

## 2019-10-14 MED ORDER — ACETAMINOPHEN 325 MG PO TABS
650.0000 mg | ORAL_TABLET | Freq: Once | ORAL | Status: AC
  Administered 2019-10-14: 650 mg via ORAL
  Filled 2019-10-14: qty 2

## 2019-10-14 MED ORDER — ONDANSETRON HCL 4 MG/2ML IJ SOLN
4.0000 mg | Freq: Once | INTRAMUSCULAR | Status: AC
  Administered 2019-10-14: 4 mg via INTRAVENOUS
  Filled 2019-10-14: qty 2

## 2019-10-14 MED ORDER — SODIUM CHLORIDE 0.9 % IV BOLUS
1000.0000 mL | Freq: Once | INTRAVENOUS | Status: AC
  Administered 2019-10-14: 1000 mL via INTRAVENOUS

## 2019-10-14 NOTE — ED Provider Notes (Signed)
[facility] Emergency Department Provider Note ____________________________________________   First MD Initiated Contact with Patient 10/14/19 1508     (approximate)  I have reviewed the triage vital signs and the nursing notes.   HISTORY  Chief Complaint Nausea    HPI Jodi Rice is a 48 y.o. female with PMH as noted below who presents with nausea and vomiting as well as decreased p.o. intake over the last few days.  The patient states she initially was diagnosed with COVID-19 a week ago and had a confirmatory positive test 4 days ago.  She states that she felt fine until the last few days when she started to have generalized weakness, body aches, nausea and vomiting, and has not really been eating much.  She denies any diarrhea or abdominal pain.  She has no shortness of breath.  Past Medical History:  Diagnosis Date  . Diabetes mellitus without complication [facility])     Patient Active Problem List   Diagnosis Date Noted  . Type 2 diabetes mellitus with nephropathy ([place]) 08/15/2019  . Adenomatous polyp of sigmoid colon   . Special screening for malignant neoplasms, colon   . Painful diabetic neuropathy ([place]) 07/04/2016  . Circadian rhythm sleep disorder, advanced sleep phase type 08/14/2008  . Hypersomnia 08/14/2008    Past Surgical History:  Procedure Laterality Date  . BREAST BIOPSY Right 1980's  . COLONOSCOPY WITH PROPOFOL N/A 12/16/2017   Procedure: COLONOSCOPY WITH PROPOFOL;  Surgeon: Lin Landsman, MD;  Location[facility] ENDOSCOPY;  Service: Gastroenterology;  Laterality: N/A;  . FLEXIBLE SIGMOIDOSCOPY N/A 02/26/2018   Procedure: FLEXIBLE SIGMOIDOSCOPY;  Surgeon: Lin Landsman, MD;  Location: [facility] ENDOSCOPY;  Service: Gastroenterology;  Laterality: N/A;  . FLEXIBLE SIGMOIDOSCOPY N/A 11/17/2018   Procedure: FLEXIBLE SIGMOIDOSCOPY;  Surgeon: Lin Landsman, MD;  Location: [facility] ENDOSCOPY;  Service: Gastroenterology;  Laterality:  N/A;  . TIBIA FRACTURE SURGERY     both legs fractured.     Prior to Admission medications   Medication Sig Start Date End Date Taking? Authorizing Provider  aspirin EC 325 MG tablet Take by mouth.    [provider]  empagliflozin (JARDIANCE) 25 MG TABS tablet Take 25 mg by mouth daily.    [provider]  metFORMIN (GLUCOPHAGE XR) 500 MG 24 hr tablet Take 1 tablet (500 mg total) by mouth daily with breakfast. 07/12/19   Elby Beck, FNP  ondansetron (ZOFRAN ODT) 8 MG disintegrating tablet Take 1 tablet (8 mg total) by mouth every 8 (eight) hours as needed for nausea or vomiting. 10/14/19   Arta Silence, MD  pregabalin (LYRICA) 150 MG capsule Take 1 capsule (150 mg total) by mouth 2 (two) times daily. 06/18/19   Edrick Kins, DPM  sitaGLIPtin (JANUVIA) 100 MG tablet Take 100 mg by mouth daily.    [provider]  traMADol (ULTRAM) 50 MG tablet Take 1 tablet (50 mg total) by mouth every 6 (six) hours as needed. 05/02/18   Loney Hering, MD    Allergies Patient has no known allergies.  Family History  Problem Relation Age of Onset  . Breast cancer Maternal Aunt 60  . Diabetes Mother   . Diabetes Sister   . Hypertension Sister     Social History Social History   Tobacco Use  . Smoking status: Former Smoker    Packs/day: 1.00    Years: 2.00    Pack years: 2.00    Types: Cigarettes    Quit date: 10/21/1986  Years since quitting: 33.0  . Smokeless tobacco: Never Used  Substance Use Topics  . Alcohol use: No  . Drug use: No    Review of Systems  Constitutional: Positive for fever. Eyes: No redness. ENT: No sore throat. Cardiovascular: Denies chest pain. Respiratory: Denies shortness of breath. Gastrointestinal: Positive for nausea and vomiting. Genitourinary: Negative for dysuria.  Musculoskeletal: Negative for back pain. Skin: Negative for rash. Neurological: Negative for  headache.   ____________________________________________   PHYSICAL EXAM:  VITAL SIGNS: ED Triage Vitals  Enc Vitals Group     BP 10/14/19 1244 (!) 99/59     Pulse Rate 10/14/19 1244 (!) 107     Resp 10/14/19 1244 16     Temp 10/14/19 1244 99.6 F (37.6 C)     Temp Source 10/14/19 1244 Oral     SpO2 10/14/19 1244 97 %     Weight 10/14/19 1241 170 lb 6.7 oz (77.3 kg)     Height --      Head Circumference --      Peak Flow --      Pain Score 10/14/19 1241 9     Pain Loc --      Pain Edu? --      Excl. in [place]? --     Constitutional: Alert and oriented.  Relative well appearing and in no acute distress. Eyes: Conjunctivae are normal.  No scleral icterus. Head: Atraumatic. Nose: No congestion/rhinnorhea. Mouth/Throat: Mucous membranes are dry.   Neck: Normal range of motion.  Cardiovascular: Tachycardic, regular rhythm.  Good peripheral circulation. Respiratory: Normal respiratory effort.  No retractions. Gastrointestinal: Soft and nontender. No distention.  Genitourinary: No flank tenderness. Musculoskeletal: Extremities warm and well perfused.  Neurologic:  Normal speech and language. No gross focal neurologic deficits are appreciated.  Skin:  Skin is warm and dry. No rash noted. Psychiatric: Mood and affect are normal. Speech and behavior are normal.  ____________________________________________   LABS (all labs ordered are listed, but only abnormal results are displayed)  Labs Reviewed  COMPREHENSIVE METABOLIC PANEL - Abnormal; Notable for the following components:      Result Value   CO2 14 (*)    Glucose, Bld 154 (*)    Calcium 8.3 (*)    Albumin 3.0 (*)    Total Bilirubin 2.0 (*)    Anion gap 23 (*)    All other components within normal limits  LIPASE, BLOOD  CBC  URINALYSIS, COMPLETE (UACMP) WITH MICROSCOPIC  POC URINE PREG, ED    ____________________________________________  EKG   ____________________________________________  RADIOLOGY    ____________________________________________   PROCEDURES  Procedure(s) performed: No  Procedures  Critical Care performed: No ____________________________________________   INITIAL IMPRESSION / ASSESSMENT AND PLAN / ED COURSE  Pertinent labs & imaging results that were available during my care of the patient were reviewed by me and considered in my medical decision making (see chart for details).  48 year old female with a history of diabetes and other PMH as noted above presents with nausea, vomiting, body aches, and generalized weakness over the last 2 days after being diagnosed with COVID-19 last week.  The patient has no respiratory symptoms.  On exam, the patient is overall relatively well-appearing.  She has a borderline low blood pressure and elevated heart rate, as well as a low-grade temperature.  O2 saturation is in the high 90s on room air.  The abdomen is soft and nontender.  The patient has no significant increased work of breathing or respiratory distress.  Overall presentation is consistent with symptoms related to COVID-19.  I suspect that the tachycardia is related to the fever, and the patient overall does not appear septic.  We will give fluids, Zofran for symptomatic treatment, and reassess.   ----------------------------------------- 6:36 PM on 10/14/2019 -----------------------------------------  The patient is feeling much better and her vital signs have normalized.  She feels well enough to go home.  She is stable for discharge at this time.  I counseled her on the results of the work-up, on appropriate diet to help mitigate her symptoms, and on return precautions.  She expressed understanding.  ____________________________________________   FINAL CLINICAL IMPRESSION(S) / ED DIAGNOSES  Final diagnoses:  Non-intractable vomiting with  nausea, unspecified vomiting type  Dehydration  COVID-19      NEW MEDICATIONS STARTED DURING THIS VISIT:  New Prescriptions   ONDANSETRON (ZOFRAN ODT) 8 MG DISINTEGRATING TABLET    Take 1 tablet (8 mg total) by mouth every 8 (eight) hours as needed for nausea or vomiting.     Note:  This document was prepared using Dragon voice recognition software and may include unintentional dictation errors.    Arta Silence, MD 10/14/19 1836

## 2019-10-14 NOTE — ED Notes (Signed)

## 2019-10-14 NOTE — ED Triage Notes (Signed)
Arrives via EMS.  Patient states she was diagnosed with COVID on Monday and presents today c.o worsening symptoms -- N/V  Decreased appetitie.

## 2019-10-14 NOTE — Discharge Instructions (Addendum)
Take the Zofran as needed for nausea.  Try to drink small amounts of liquids at a time and easy to digest foods such as broth in order to stay hydrated.  Return to the ER immediately for new, worsening, or persistent severe vomiting, weakness or lightheadedness, shortness of breath, or any other new or worsening symptoms that concern you.

## 2019-10-14 NOTE — ED Triage Notes (Signed)
Tested pos covid Monday.  Increased weakness, nausea and vomiting.  12 lead unremarkable.  fsbs  283

## 2019-10-15 ENCOUNTER — Other Ambulatory Visit: Payer: Self-pay

## 2019-10-15 DIAGNOSIS — G4710 Hypersomnia, unspecified: Secondary | ICD-10-CM | POA: Diagnosis present

## 2019-10-15 DIAGNOSIS — G4722 Circadian rhythm sleep disorder, advanced sleep phase type: Secondary | ICD-10-CM | POA: Diagnosis present

## 2019-10-15 DIAGNOSIS — Z7984 Long term (current) use of oral hypoglycemic drugs: Secondary | ICD-10-CM

## 2019-10-15 DIAGNOSIS — E861 Hypovolemia: Secondary | ICD-10-CM | POA: Diagnosis present

## 2019-10-15 DIAGNOSIS — J159 Unspecified bacterial pneumonia: Secondary | ICD-10-CM | POA: Diagnosis present

## 2019-10-15 DIAGNOSIS — E1142 Type 2 diabetes mellitus with diabetic polyneuropathy: Secondary | ICD-10-CM | POA: Diagnosis present

## 2019-10-15 DIAGNOSIS — J9601 Acute respiratory failure with hypoxia: Secondary | ICD-10-CM | POA: Diagnosis present

## 2019-10-15 DIAGNOSIS — E1110 Type 2 diabetes mellitus with ketoacidosis without coma: Secondary | ICD-10-CM | POA: Diagnosis present

## 2019-10-15 DIAGNOSIS — Z87891 Personal history of nicotine dependence: Secondary | ICD-10-CM

## 2019-10-15 DIAGNOSIS — R001 Bradycardia, unspecified: Secondary | ICD-10-CM | POA: Diagnosis not present

## 2019-10-15 DIAGNOSIS — Z79899 Other long term (current) drug therapy: Secondary | ICD-10-CM

## 2019-10-15 DIAGNOSIS — Z7982 Long term (current) use of aspirin: Secondary | ICD-10-CM

## 2019-10-15 DIAGNOSIS — J1289 Other viral pneumonia: Secondary | ICD-10-CM | POA: Diagnosis present

## 2019-10-15 DIAGNOSIS — U071 COVID-19: Principal | ICD-10-CM | POA: Diagnosis present

## 2019-10-15 DIAGNOSIS — E559 Vitamin D deficiency, unspecified: Secondary | ICD-10-CM | POA: Diagnosis present

## 2019-10-15 DIAGNOSIS — E785 Hyperlipidemia, unspecified: Secondary | ICD-10-CM | POA: Diagnosis present

## 2019-10-15 DIAGNOSIS — K869 Disease of pancreas, unspecified: Secondary | ICD-10-CM | POA: Diagnosis present

## 2019-10-15 DIAGNOSIS — Z8249 Family history of ischemic heart disease and other diseases of the circulatory system: Secondary | ICD-10-CM

## 2019-10-15 DIAGNOSIS — I959 Hypotension, unspecified: Secondary | ICD-10-CM | POA: Diagnosis not present

## 2019-10-15 DIAGNOSIS — E1121 Type 2 diabetes mellitus with diabetic nephropathy: Secondary | ICD-10-CM | POA: Diagnosis present

## 2019-10-15 DIAGNOSIS — N179 Acute kidney failure, unspecified: Secondary | ICD-10-CM | POA: Diagnosis present

## 2019-10-15 DIAGNOSIS — I10 Essential (primary) hypertension: Secondary | ICD-10-CM | POA: Diagnosis present

## 2019-10-15 DIAGNOSIS — Z833 Family history of diabetes mellitus: Secondary | ICD-10-CM

## 2019-10-15 DIAGNOSIS — E860 Dehydration: Secondary | ICD-10-CM | POA: Diagnosis present

## 2019-10-15 DIAGNOSIS — R000 Tachycardia, unspecified: Secondary | ICD-10-CM | POA: Diagnosis not present

## 2019-10-15 DIAGNOSIS — Z79891 Long term (current) use of opiate analgesic: Secondary | ICD-10-CM

## 2019-10-15 MED ORDER — ONDANSETRON 4 MG PO TBDP
4.0000 mg | ORAL_TABLET | Freq: Once | ORAL | Status: AC
  Administered 2019-10-15: 4 mg via ORAL
  Filled 2019-10-15: qty 1

## 2019-10-15 NOTE — ED Notes (Signed)
Spoke with Dr Cherylann Banas regarding pt's presenting s/x's and c/o's. No imaging or labs indicated at this time; pt can be given ODT Zofran for nausea.

## 2019-10-15 NOTE — ED Triage Notes (Addendum)
Pt arrives to ED via [facility] from home with c/o continued COVID-like s/x's. Pt states she was seen and treated here yesterday but was unable to get her prescriptions filled d/t the holiday and returned here for nausea management. Pt reports "24" episodes of emesis in the last 24 hrs. Pt is A&O, in NAD; RR even, regular and unlabored.

## 2019-10-15 NOTE — ED Notes (Signed)
First Nurse Note:  Pt in via [facility] from home, per EMS, pt is Covid+ w/ complaints of shortness of breath, abdominal pain, N/V.  Pt was seen here for same yesterday but unable to get medications filled due to the holiday.    CBG 259.  NAD noted at this time.

## 2019-10-16 ENCOUNTER — Inpatient Hospital Stay
Admission: EM | Admit: 2019-10-16 | Discharge: 2019-10-22 | DRG: 208 | Disposition: E | Payer: 59 | Attending: Internal Medicine | Admitting: Internal Medicine

## 2019-10-16 ENCOUNTER — Emergency Department: Payer: 59

## 2019-10-16 DIAGNOSIS — E872 Acidosis, unspecified: Secondary | ICD-10-CM

## 2019-10-16 DIAGNOSIS — E1121 Type 2 diabetes mellitus with diabetic nephropathy: Secondary | ICD-10-CM | POA: Diagnosis present

## 2019-10-16 DIAGNOSIS — R0902 Hypoxemia: Secondary | ICD-10-CM | POA: Diagnosis not present

## 2019-10-16 DIAGNOSIS — E861 Hypovolemia: Secondary | ICD-10-CM | POA: Diagnosis present

## 2019-10-16 DIAGNOSIS — I10 Essential (primary) hypertension: Secondary | ICD-10-CM | POA: Diagnosis present

## 2019-10-16 DIAGNOSIS — N179 Acute kidney failure, unspecified: Secondary | ICD-10-CM | POA: Diagnosis present

## 2019-10-16 DIAGNOSIS — G4710 Hypersomnia, unspecified: Secondary | ICD-10-CM | POA: Diagnosis present

## 2019-10-16 DIAGNOSIS — G4722 Circadian rhythm sleep disorder, advanced sleep phase type: Secondary | ICD-10-CM | POA: Diagnosis present

## 2019-10-16 DIAGNOSIS — J9601 Acute respiratory failure with hypoxia: Secondary | ICD-10-CM | POA: Diagnosis present

## 2019-10-16 DIAGNOSIS — E860 Dehydration: Secondary | ICD-10-CM | POA: Diagnosis present

## 2019-10-16 DIAGNOSIS — E1142 Type 2 diabetes mellitus with diabetic polyneuropathy: Secondary | ICD-10-CM | POA: Diagnosis present

## 2019-10-16 DIAGNOSIS — U071 COVID-19: Secondary | ICD-10-CM | POA: Diagnosis not present

## 2019-10-16 DIAGNOSIS — E0810 Diabetes mellitus due to underlying condition with ketoacidosis without coma: Secondary | ICD-10-CM

## 2019-10-16 DIAGNOSIS — I959 Hypotension, unspecified: Secondary | ICD-10-CM | POA: Diagnosis not present

## 2019-10-16 DIAGNOSIS — A419 Sepsis, unspecified organism: Secondary | ICD-10-CM

## 2019-10-16 DIAGNOSIS — J1289 Other viral pneumonia: Secondary | ICD-10-CM | POA: Diagnosis present

## 2019-10-16 DIAGNOSIS — E785 Hyperlipidemia, unspecified: Secondary | ICD-10-CM | POA: Diagnosis present

## 2019-10-16 DIAGNOSIS — E1110 Type 2 diabetes mellitus with ketoacidosis without coma: Secondary | ICD-10-CM | POA: Diagnosis not present

## 2019-10-16 DIAGNOSIS — R7989 Other specified abnormal findings of blood chemistry: Secondary | ICD-10-CM

## 2019-10-16 DIAGNOSIS — J159 Unspecified bacterial pneumonia: Secondary | ICD-10-CM | POA: Diagnosis present

## 2019-10-16 DIAGNOSIS — R001 Bradycardia, unspecified: Secondary | ICD-10-CM | POA: Diagnosis not present

## 2019-10-16 DIAGNOSIS — K869 Disease of pancreas, unspecified: Secondary | ICD-10-CM | POA: Diagnosis present

## 2019-10-16 DIAGNOSIS — R000 Tachycardia, unspecified: Secondary | ICD-10-CM | POA: Diagnosis not present

## 2019-10-16 DIAGNOSIS — J22 Unspecified acute lower respiratory infection: Secondary | ICD-10-CM

## 2019-10-16 DIAGNOSIS — E1010 Type 1 diabetes mellitus with ketoacidosis without coma: Secondary | ICD-10-CM | POA: Diagnosis not present

## 2019-10-16 DIAGNOSIS — R778 Other specified abnormalities of plasma proteins: Secondary | ICD-10-CM

## 2019-10-16 LAB — BLOOD GAS, ARTERIAL
Acid-base deficit: 24 mmol/L — ABNORMAL HIGH (ref 0.0–2.0)
Bicarbonate: 3.9 mmol/L — ABNORMAL LOW (ref 20.0–28.0)
FIO2: 0.21
O2 Saturation: 89.3 %
Patient temperature: 37
pCO2 arterial: <19 mmHg — CL (ref 32.0–48.0)
pH, Arterial: 7.08 — CL (ref 7.350–7.450)
pO2, Arterial: 80 mmHg — ABNORMAL LOW (ref 83.0–108.0)

## 2019-10-16 LAB — BASIC METABOLIC PANEL
Anion gap: 23 — ABNORMAL HIGH (ref 5–15)
Anion gap: 13 (ref 5–15)
Anion gap: 11 (ref 5–15)
Anion gap: 13 (ref 5–15)
BUN: 34 mg/dL — ABNORMAL HIGH (ref 6–20)
BUN: 31 mg/dL — ABNORMAL HIGH (ref 6–20)
BUN: 30 mg/dL — ABNORMAL HIGH (ref 6–20)
BUN: 28 mg/dL — ABNORMAL HIGH (ref 6–20)
CO2: 11 mmol/L — ABNORMAL LOW (ref 22–32)
CO2: 19 mmol/L — ABNORMAL LOW (ref 22–32)
CO2: 20 mmol/L — ABNORMAL LOW (ref 22–32)
CO2: 19 mmol/L — ABNORMAL LOW (ref 22–32)
Calcium: 7.8 mg/dL — ABNORMAL LOW (ref 8.9–10.3)
Calcium: 7.5 mg/dL — ABNORMAL LOW (ref 8.9–10.3)
Calcium: 7.8 mg/dL — ABNORMAL LOW (ref 8.9–10.3)
Calcium: 8.1 mg/dL — ABNORMAL LOW (ref 8.9–10.3)
Chloride: 112 mmol/L — ABNORMAL HIGH (ref 98–111)
Chloride: 112 mmol/L — ABNORMAL HIGH (ref 98–111)
Chloride: 114 mmol/L — ABNORMAL HIGH (ref 98–111)
Chloride: 113 mmol/L — ABNORMAL HIGH (ref 98–111)
Creatinine, Ser: 1.07 mg/dL — ABNORMAL HIGH (ref 0.44–1.00)
Creatinine, Ser: 0.91 mg/dL (ref 0.44–1.00)
Creatinine, Ser: 0.93 mg/dL (ref 0.44–1.00)
Creatinine, Ser: 0.79 mg/dL (ref 0.44–1.00)
GFR calc Af Amer: >60 mL/min (ref >60)
GFR calc Af Amer: >60 mL/min (ref >60)
GFR calc Af Amer: >60 mL/min (ref >60)
GFR calc Af Amer: >60 mL/min (ref >60)
GFR calc non Af Amer: >60 mL/min (ref >60)
GFR calc non Af Amer: >60 mL/min (ref >60)
GFR calc non Af Amer: >60 mL/min (ref >60)
GFR calc non Af Amer: >60 mL/min (ref >60)
Glucose, Bld: 179 mg/dL — ABNORMAL HIGH (ref 70–99)
Glucose, Bld: 145 mg/dL — ABNORMAL HIGH (ref 70–99)
Glucose, Bld: 166 mg/dL — ABNORMAL HIGH (ref 70–99)
Glucose, Bld: 157 mg/dL — ABNORMAL HIGH (ref 70–99)
Potassium: 3.3 mmol/L — ABNORMAL LOW (ref 3.5–5.1)
Potassium: 3.9 mmol/L (ref 3.5–5.1)
Potassium: 4 mmol/L (ref 3.5–5.1)
Potassium: 4.1 mmol/L (ref 3.5–5.1)
Sodium: 146 mmol/L — ABNORMAL HIGH (ref 135–145)
Sodium: 144 mmol/L (ref 135–145)
Sodium: 145 mmol/L (ref 135–145)
Sodium: 145 mmol/L (ref 135–145)

## 2019-10-16 LAB — CBC WITH DIFFERENTIAL/PLATELET
Abs Immature Granulocytes: 0.43 10*3/uL — ABNORMAL HIGH (ref 0.00–0.07)
Basophils Absolute: 0.1 10*3/uL (ref 0.0–0.1)
Basophils Relative: 0 %
Eosinophils Absolute: 0 10*3/uL (ref 0.0–0.5)
Eosinophils Relative: 0 %
HCT: 44.9 % (ref 36.0–46.0)
Hemoglobin: 14.4 g/dL (ref 12.0–15.0)
Immature Granulocytes: 2 %
Lymphocytes Relative: 8 %
Lymphs Abs: 1.8 10*3/uL (ref 0.7–4.0)
MCH: 28.6 pg (ref 26.0–34.0)
MCHC: 32.1 g/dL (ref 30.0–36.0)
MCV: 89.1 fL (ref 80.0–100.0)
Monocytes Absolute: 0.8 10*3/uL (ref 0.1–1.0)
Monocytes Relative: 3 %
Neutro Abs: 19.6 10*3/uL — ABNORMAL HIGH (ref 1.7–7.7)
Neutrophils Relative %: 87 %
Platelets: 471 10*3/uL — ABNORMAL HIGH (ref 150–400)
RBC: 5.04 MIL/uL (ref 3.87–5.11)
RDW: 14.4 % (ref 11.5–15.5)
WBC: 22.7 10*3/uL — ABNORMAL HIGH (ref 4.0–10.5)
nRBC: 0 % (ref 0.0–0.2)

## 2019-10-16 LAB — GLUCOSE, CAPILLARY
Glucose-Capillary: 122 mg/dL — ABNORMAL HIGH (ref 70–99)
Glucose-Capillary: 125 mg/dL — ABNORMAL HIGH (ref 70–99)
Glucose-Capillary: 131 mg/dL — ABNORMAL HIGH (ref 70–99)
Glucose-Capillary: 135 mg/dL — ABNORMAL HIGH (ref 70–99)
Glucose-Capillary: 135 mg/dL — ABNORMAL HIGH (ref 70–99)
Glucose-Capillary: 137 mg/dL — ABNORMAL HIGH (ref 70–99)
Glucose-Capillary: 140 mg/dL — ABNORMAL HIGH (ref 70–99)
Glucose-Capillary: 147 mg/dL — ABNORMAL HIGH (ref 70–99)
Glucose-Capillary: 148 mg/dL — ABNORMAL HIGH (ref 70–99)
Glucose-Capillary: 151 mg/dL — ABNORMAL HIGH (ref 70–99)
Glucose-Capillary: 151 mg/dL — ABNORMAL HIGH (ref 70–99)
Glucose-Capillary: 162 mg/dL — ABNORMAL HIGH (ref 70–99)
Glucose-Capillary: 170 mg/dL — ABNORMAL HIGH (ref 70–99)
Glucose-Capillary: 172 mg/dL — ABNORMAL HIGH (ref 70–99)
Glucose-Capillary: 178 mg/dL — ABNORMAL HIGH (ref 70–99)
Glucose-Capillary: 276 mg/dL — ABNORMAL HIGH (ref 70–99)
Glucose-Capillary: 340 mg/dL — ABNORMAL HIGH (ref 70–99)
Glucose-Capillary: 375 mg/dL — ABNORMAL HIGH (ref 70–99)

## 2019-10-16 LAB — HEPARIN LEVEL (UNFRACTIONATED)
Heparin Unfractionated: 0.51 IU/mL (ref 0.30–0.70)
Heparin Unfractionated: 0.79 IU/mL — ABNORMAL HIGH (ref 0.30–0.70)

## 2019-10-16 LAB — COMPREHENSIVE METABOLIC PANEL
ALT: 16 U/L (ref 0–44)
AST: 31 U/L (ref 15–41)
Albumin: 3 g/dL — ABNORMAL LOW (ref 3.5–5.0)
Alkaline Phosphatase: 94 U/L (ref 38–126)
BUN: 27 mg/dL — ABNORMAL HIGH (ref 6–20)
CO2: <7 mmol/L — ABNORMAL LOW (ref 22–32)
Calcium: 8.7 mg/dL — ABNORMAL LOW (ref 8.9–10.3)
Chloride: 107 mmol/L (ref 98–111)
Creatinine, Ser: 1.27 mg/dL — ABNORMAL HIGH (ref 0.44–1.00)
GFR calc Af Amer: 57 mL/min — ABNORMAL LOW (ref >60)
GFR calc non Af Amer: 49 mL/min — ABNORMAL LOW (ref >60)
Glucose, Bld: 389 mg/dL — ABNORMAL HIGH (ref 70–99)
Potassium: 4.2 mmol/L (ref 3.5–5.1)
Sodium: 146 mmol/L — ABNORMAL HIGH (ref 135–145)
Total Bilirubin: 2.4 mg/dL — ABNORMAL HIGH (ref 0.3–1.2)
Total Protein: 7.6 g/dL (ref 6.5–8.1)

## 2019-10-16 LAB — LACTIC ACID, PLASMA
Lactic Acid, Venous: 4.3 mmol/L (ref 0.5–1.9)
Lactic Acid, Venous: 2.4 mmol/L (ref 0.5–1.9)
Lactic Acid, Venous: 1.1 mmol/L (ref 0.5–1.9)

## 2019-10-16 LAB — TROPONIN I (HIGH SENSITIVITY): Troponin I (High Sensitivity): 20 ng/L — ABNORMAL HIGH (ref <18)

## 2019-10-16 LAB — TRIGLYCERIDES: Triglycerides: 236 mg/dL — ABNORMAL HIGH (ref <150)

## 2019-10-16 LAB — APTT: aPTT: 92 seconds — ABNORMAL HIGH (ref 24–36)

## 2019-10-16 LAB — FIBRINOGEN: Fibrinogen: >750 mg/dL — ABNORMAL HIGH (ref 210–475)

## 2019-10-16 LAB — C-REACTIVE PROTEIN: CRP: 25.5 mg/dL — ABNORMAL HIGH (ref <1.0)

## 2019-10-16 LAB — BETA-HYDROXYBUTYRIC ACID: Beta-Hydroxybutyric Acid: >8 mmol/L — ABNORMAL HIGH (ref 0.05–0.27)

## 2019-10-16 LAB — FERRITIN: Ferritin: 331 ng/mL — ABNORMAL HIGH (ref 11–307)

## 2019-10-16 LAB — PROTIME-INR
INR: 1.1 (ref 0.8–1.2)
Prothrombin Time: 14.3 seconds (ref 11.4–15.2)

## 2019-10-16 LAB — FIBRIN DERIVATIVES D-DIMER (ARMC ONLY): Fibrin derivatives D-dimer (ARMC): >7500 ng/mL (FEU) — ABNORMAL HIGH (ref 0.00–499.00)

## 2019-10-16 LAB — MRSA PCR SCREENING: MRSA by PCR: NEGATIVE

## 2019-10-16 LAB — LACTATE DEHYDROGENASE: LDH: 306 U/L — ABNORMAL HIGH (ref 98–192)

## 2019-10-16 LAB — HIV ANTIBODY (ROUTINE TESTING W REFLEX): HIV Screen 4th Generation wRfx: NONREACTIVE

## 2019-10-16 LAB — PROCALCITONIN: Procalcitonin: 0.23 ng/mL

## 2019-10-16 LAB — BRAIN NATRIURETIC PEPTIDE: B Natriuretic Peptide: 118 pg/mL — ABNORMAL HIGH (ref 0.0–100.0)

## 2019-10-16 LAB — ABO/RH: ABO/RH(D): O POS

## 2019-10-16 MED ORDER — ASPIRIN EC 81 MG PO TBEC
81.0000 mg | DELAYED_RELEASE_TABLET | Freq: Every day | ORAL | Status: DC
  Administered 2019-10-16 – 2019-10-20 (×5): 81 mg via ORAL
  Filled 2019-10-16 (×5): qty 1

## 2019-10-16 MED ORDER — ZINC SULFATE 220 (50 ZN) MG PO CAPS
220.0000 mg | ORAL_CAPSULE | Freq: Every day | ORAL | Status: DC
  Administered 2019-10-16 – 2019-10-20 (×5): 220 mg via ORAL
  Filled 2019-10-16 (×5): qty 1

## 2019-10-16 MED ORDER — ALBUTEROL SULFATE HFA 108 (90 BASE) MCG/ACT IN AERS
1.0000 | INHALATION_SPRAY | RESPIRATORY_TRACT | Status: DC | PRN
  Administered 2019-10-19: 2 via RESPIRATORY_TRACT
  Filled 2019-10-16 (×3): qty 6.7

## 2019-10-16 MED ORDER — LACTATED RINGERS IV BOLUS
1000.0000 mL | Freq: Once | INTRAVENOUS | Status: AC
  Administered 2019-10-16: 1000 mL via INTRAVENOUS

## 2019-10-16 MED ORDER — GUAIFENESIN-DM 100-10 MG/5ML PO SYRP
10.0000 mL | ORAL_SOLUTION | ORAL | Status: DC | PRN
  Administered 2019-10-16 – 2019-10-20 (×9): 10 mL via ORAL
  Filled 2019-10-16 (×11): qty 10

## 2019-10-16 MED ORDER — THIAMINE HCL 100 MG PO TABS
100.0000 mg | ORAL_TABLET | Freq: Every day | ORAL | Status: DC
  Administered 2019-10-16 – 2019-10-20 (×5): 100 mg via ORAL
  Filled 2019-10-16 (×5): qty 1

## 2019-10-16 MED ORDER — LACTATED RINGERS IV SOLN: INTRAVENOUS | Status: DC

## 2019-10-16 MED ORDER — ASCORBIC ACID 500 MG PO TABS
500.0000 mg | ORAL_TABLET | Freq: Every day | ORAL | Status: DC
  Administered 2019-10-16 – 2019-10-20 (×5): 500 mg via ORAL
  Filled 2019-10-16 (×5): qty 1

## 2019-10-16 MED ORDER — SODIUM CHLORIDE 0.9 % IV SOLN
500.0000 mg | INTRAVENOUS | Status: DC
  Administered 2019-10-16 – 2019-10-17 (×2): 500 mg via INTRAVENOUS
  Filled 2019-10-16 (×3): qty 500

## 2019-10-16 MED ORDER — ORAL CARE MOUTH RINSE
15.0000 mL | Freq: Two times a day (BID) | OROMUCOSAL | Status: DC
  Administered 2019-10-17 – 2019-10-20 (×5): 15 mL via OROMUCOSAL

## 2019-10-16 MED ORDER — POTASSIUM CHLORIDE 10 MEQ/100ML IV SOLN
10.0000 meq | INTRAVENOUS | Status: DC
  Filled 2019-10-16 (×2): qty 100

## 2019-10-16 MED ORDER — ACETAMINOPHEN 325 MG PO TABS
650.0000 mg | ORAL_TABLET | Freq: Four times a day (QID) | ORAL | Status: DC | PRN
  Administered 2019-10-17: 650 mg via ORAL
  Filled 2019-10-16: qty 2

## 2019-10-16 MED ORDER — SODIUM CHLORIDE 0.9 % IV SOLN
100.0000 mg | Freq: Every day | INTRAVENOUS | Status: AC
  Administered 2019-10-17 – 2019-10-20 (×4): 100 mg via INTRAVENOUS
  Filled 2019-10-16: qty 100
  Filled 2019-10-16: qty 20
  Filled 2019-10-16 (×2): qty 100

## 2019-10-16 MED ORDER — SODIUM CHLORIDE 0.9 % IV SOLN
200.0000 mg | Freq: Once | INTRAVENOUS | Status: AC
  Administered 2019-10-16: 200 mg via INTRAVENOUS
  Filled 2019-10-16: qty 200

## 2019-10-16 MED ORDER — HEPARIN BOLUS VIA INFUSION
5000.0000 [IU] | Freq: Once | INTRAVENOUS | Status: AC
  Administered 2019-10-16: 5000 [IU] via INTRAVENOUS
  Filled 2019-10-16: qty 5000

## 2019-10-16 MED ORDER — INSULIN REGULAR(HUMAN) IN NACL 100-0.9 UT/100ML-% IV SOLN
INTRAVENOUS | Status: DC
  Administered 2019-10-16: 14 [IU]/h via INTRAVENOUS
  Administered 2019-10-16: 2.6 [IU]/h via INTRAVENOUS
  Administered 2019-10-16: 2.8 [IU]/h via INTRAVENOUS
  Filled 2019-10-16 (×2): qty 100

## 2019-10-16 MED ORDER — FOLIC ACID 1 MG PO TABS
1.0000 mg | ORAL_TABLET | Freq: Every day | ORAL | Status: DC
  Administered 2019-10-16 – 2019-10-20 (×5): 1 mg via ORAL
  Filled 2019-10-16 (×5): qty 1

## 2019-10-16 MED ORDER — DEXAMETHASONE SODIUM PHOSPHATE 10 MG/ML IJ SOLN
6.0000 mg | INTRAMUSCULAR | Status: DC
  Administered 2019-10-16 – 2019-10-19 (×4): 6 mg via INTRAVENOUS
  Filled 2019-10-16 (×5): qty 0.6

## 2019-10-16 MED ORDER — DEXTROSE 50 % IV SOLN: 0.0000 mL | INTRAVENOUS | Status: DC | PRN

## 2019-10-16 MED ORDER — ADULT MULTIVITAMIN W/MINERALS CH
1.0000 | ORAL_TABLET | Freq: Every day | ORAL | Status: DC
  Administered 2019-10-16 – 2019-10-20 (×4): 1 via ORAL
  Filled 2019-10-16 (×5): qty 1

## 2019-10-16 MED ORDER — CHLORHEXIDINE GLUCONATE CLOTH 2 % EX PADS
6.0000 | MEDICATED_PAD | Freq: Every day | CUTANEOUS | Status: DC
  Administered 2019-10-16 – 2019-10-19 (×4): 6 via TOPICAL
  Filled 2019-10-16: qty 6

## 2019-10-16 MED ORDER — DEXAMETHASONE SODIUM PHOSPHATE 10 MG/ML IJ SOLN
10.0000 mg | Freq: Once | INTRAMUSCULAR | Status: AC
  Administered 2019-10-16: 10 mg via INTRAVENOUS
  Filled 2019-10-16: qty 1

## 2019-10-16 MED ORDER — DEXTROSE-NACL 5-0.45 % IV SOLN: INTRAVENOUS | Status: DC

## 2019-10-16 MED ORDER — PREGABALIN 75 MG PO CAPS
150.0000 mg | ORAL_CAPSULE | Freq: Two times a day (BID) | ORAL | Status: DC
  Administered 2019-10-16 – 2019-10-20 (×10): 150 mg via ORAL
  Filled 2019-10-16 (×10): qty 2

## 2019-10-16 MED ORDER — SODIUM CHLORIDE 0.9 % IV SOLN
2.0000 g | INTRAVENOUS | Status: DC
  Administered 2019-10-16 – 2019-10-17 (×2): 2 g via INTRAVENOUS
  Filled 2019-10-16 (×2): qty 20
  Filled 2019-10-16: qty 2

## 2019-10-16 MED ORDER — SODIUM CHLORIDE 0.9 % IV SOLN: INTRAVENOUS | Status: DC

## 2019-10-16 MED ORDER — SODIUM CHLORIDE 0.9 % IV BOLUS
1000.0000 mL | Freq: Once | INTRAVENOUS | Status: AC
  Administered 2019-10-16: 1000 mL via INTRAVENOUS

## 2019-10-16 MED ORDER — LACTATED RINGERS IV BOLUS (SEPSIS)
1500.0000 mL | Freq: Once | INTRAVENOUS | Status: AC
  Administered 2019-10-16: 1500 mL via INTRAVENOUS

## 2019-10-16 MED ORDER — SODIUM BICARBONATE 8.4 % IV SOLN
100.0000 meq | Freq: Once | INTRAVENOUS | Status: AC
  Administered 2019-10-16: 100 meq via INTRAVENOUS
  Filled 2019-10-16: qty 50

## 2019-10-16 MED ORDER — POTASSIUM CHLORIDE CRYS ER 20 MEQ PO TBCR
20.0000 meq | EXTENDED_RELEASE_TABLET | Freq: Once | ORAL | Status: AC
  Administered 2019-10-16: 14:00:00 20 meq via ORAL
  Filled 2019-10-16: qty 1

## 2019-10-16 MED ORDER — FAMOTIDINE IN NACL 20-0.9 MG/50ML-% IV SOLN
20.0000 mg | INTRAVENOUS | Status: DC
  Administered 2019-10-16 – 2019-10-19 (×4): 20 mg via INTRAVENOUS
  Filled 2019-10-16 (×4): qty 50

## 2019-10-16 MED ORDER — POTASSIUM CHLORIDE 10 MEQ/100ML IV SOLN
10.0000 meq | INTRAVENOUS | Status: AC
  Administered 2019-10-16 (×2): 10 meq via INTRAVENOUS
  Filled 2019-10-16 (×2): qty 100

## 2019-10-16 MED ORDER — ONDANSETRON HCL 4 MG/2ML IJ SOLN
4.0000 mg | INTRAMUSCULAR | Status: AC
  Administered 2019-10-16: 4 mg via INTRAVENOUS
  Filled 2019-10-16: qty 2

## 2019-10-16 MED ORDER — HEPARIN (PORCINE) 25000 UT/250ML-% IV SOLN
1400.0000 [IU]/h | INTRAVENOUS | Status: DC
  Administered 2019-10-16: 1300 [IU]/h via INTRAVENOUS
  Administered 2019-10-16: 1200 [IU]/h via INTRAVENOUS
  Administered 2019-10-16: 1300 [IU]/h via INTRAVENOUS
  Administered 2019-10-18: 1150 [IU]/h via INTRAVENOUS
  Filled 2019-10-16 (×5): qty 250

## 2019-10-16 MED ORDER — ENOXAPARIN SODIUM 40 MG/0.4ML ~~LOC~~ SOLN: 40.0000 mg | SUBCUTANEOUS | Status: DC

## 2019-10-16 NOTE — ED Notes (Signed)
This RN was at bedside assisting MD Karma Greaser while he placed sterile line. Right patient, procedure, site all verified. Patient verbalized understanding of procedure and gave verbal consent. Sterile field maintained.

## 2019-10-16 NOTE — Progress Notes (Signed)
CODE SEPSIS - PHARMACY COMMUNICATION  **Broad Spectrum Antibiotics should be administered within 1 hour of Sepsis diagnosis**  Time Code Sepsis Called/Page Received: 0337  Antibiotics Ordered: Rocephin and Zithromax  Time of 1st antibiotic administration: 0416, Rocephin  Additional action taken by pharmacy: n/a  If necessary, Name of Provider/Nurse Contacted: n/a   Ena Dawley ,PharmD Clinical Pharmacist  10/15/2019  3:48 AM

## 2019-10-16 NOTE — Progress Notes (Signed)
ANTICOAGULATION CONSULT NOTE -   Pharmacy Consult for Heparin Indication: elevated D Dimer, Covid + pt, treat as suspected PE  No Known Allergies  Patient Measurements: Height: 5\' 8"  (172.7 cm) Weight: 178 lb (80.7 kg) IBW/kg (Calculated) : 63.9 HEPARIN DW (KG): 80.1  Vital Signs: Temp: 99.7 F (37.6 C) (12/26 1300) Temp Source: Core (12/26 1200) BP: 99/65 (12/26 1300) Pulse Rate: 108 (12/26 1300)  Labs: Recent Labs    10/14/19 1250 10/17/2019 0239 10/02/2019 0746 09/27/2019 0907 10/15/2019 1258  HGB 13.9 14.4  --   --   --   HCT 42.0 44.9  --   --   --   PLT 358 471*  --   --   --   APTT  --   --  92*  --   --   LABPROT  --   --  14.3  --   --   INR  --   --  1.1  --   --   HEPARINUNFRC  --   --   --   --  0.51  CREATININE 0.87 1.27*  --  1.07* 0.91  TROPONINIHS  --  20*  --   --   --     Estimated Creatinine Clearance: 82.4 mL/min (by C-G formula based on SCr of 0.91 mg/dL).  Medical History: Past Medical History:  Diagnosis Date  . Diabetes mellitus without complication (HCC)     Medications:  Medications Prior to Admission  Medication Sig Dispense Refill Last Dose  . aspirin EC 325 MG tablet Take by mouth.     . empagliflozin (JARDIANCE) 25 MG TABS tablet Take 25 mg by mouth daily.     . metFORMIN (GLUCOPHAGE XR) 500 MG 24 hr tablet Take 1 tablet (500 mg total) by mouth daily with breakfast. 90 tablet 1   . ondansetron (ZOFRAN ODT) 8 MG disintegrating tablet Take 1 tablet (8 mg total) by mouth every 8 (eight) hours as needed for nausea or vomiting. 15 tablet 0   . pregabalin (LYRICA) 150 MG capsule Take 1 capsule (150 mg total) by mouth 2 (two) times daily. 60 capsule 1   . sitaGLIPtin (JANUVIA) 100 MG tablet Take 100 mg by mouth daily.     . traMADol (ULTRAM) 50 MG tablet Take 1 tablet (50 mg total) by mouth every 6 (six) hours as needed. 12 tablet 0     Assessment: Pharmacy asked to initiate and monitor Heparin for suspected PE, pt is Covid +.  No  anticoagulants PTa per current med list.  Baseline labs pending.   Goal of Therapy:  Heparin level 0.3-0.7 units/ml Monitor platelets by anticoagulation protocol: Yes   Plan:  Heparin bolus 5000 units x 1 then infusion at 1300 units/hr Check heparin level in 6 hours  12/26 1258 HL= 0.51. Will continue current drip rate and check confirmatory level in 6 hours.   Azlaan Isidore A 09/29/2019,2:04 PM

## 2019-10-16 NOTE — Progress Notes (Signed)
ANTICOAGULATION CONSULT NOTE -   Pharmacy Consult for Heparin Indication: elevated D Dimer, Covid + pt, treat as suspected PE  No Known Allergies  Patient Measurements: Height: 5\' 8"  (172.7 cm) Weight: 178 lb (80.7 kg) IBW/kg (Calculated) : 63.9 HEPARIN DW (KG): 80.1  Vital Signs: Temp: 100.2 F (37.9 C) (12/26 1800) Temp Source: Core (12/26 1600) BP: 106/66 (12/26 1800) Pulse Rate: 98 (12/26 1800)  Labs: Recent Labs    10/14/19 1250 09/28/2019 0239 10/15/2019 0746 10/08/2019 0907 10/14/2019 1258 10/20/2019 1649 10/17/2019 1854  HGB 13.9 14.4  --   --   --   --   --   HCT 42.0 44.9  --   --   --   --   --   PLT 358 471*  --   --   --   --   --   APTT  --   --  92*  --   --   --   --   LABPROT  --   --  14.3  --   --   --   --   INR  --   --  1.1  --   --   --   --   HEPARINUNFRC  --   --   --   --  0.51  --  0.79*  CREATININE 0.87 1.27*  --  1.07* 0.91 0.93  --   TROPONINIHS  --  20*  --   --   --   --   --     Estimated Creatinine Clearance: 80.7 mL/min (by C-G formula based on SCr of 0.93 mg/dL).  Medical History: Past Medical History:  Diagnosis Date  . Diabetes mellitus without complication (HCC)     Medications:  Medications Prior to Admission  Medication Sig Dispense Refill Last Dose  . empagliflozin (JARDIANCE) 25 MG TABS tablet Take 25 mg by mouth daily.   Past Week at Unknown time  . metFORMIN (GLUCOPHAGE XR) 500 MG 24 hr tablet Take 1 tablet (500 mg total) by mouth daily with breakfast. 90 tablet 1 Past Week at Unknown time  . ondansetron (ZOFRAN ODT) 8 MG disintegrating tablet Take 1 tablet (8 mg total) by mouth every 8 (eight) hours as needed for nausea or vomiting. 15 tablet 0 Past Week at Unknown time  . pregabalin (LYRICA) 150 MG capsule Take 1 capsule (150 mg total) by mouth 2 (two) times daily. 60 capsule 1 Past Week at Unknown time  . sitaGLIPtin (JANUVIA) 100 MG tablet Take 100 mg by mouth daily.   Past Week at Unknown time  . traMADol (ULTRAM) 50 MG  tablet Take 1 tablet (50 mg total) by mouth every 6 (six) hours as needed. (Patient not taking: Reported on 10/15/2019) 12 tablet 0 Completed Course at Unknown time    Assessment: Pharmacy asked to initiate and monitor Heparin for suspected PE, pt is Covid +.  No anticoagulants PTa per current med list.  Baseline labs pending.   12/26 1258 HL= 0.51. Therapeutic, continue rate of 1300 units/hr 12/26 1854 HL = 0.79. Supratherapeutic   Goal of Therapy:  Heparin level 0.3-0.7 units/ml Monitor platelets by anticoagulation protocol: Yes   Plan:  HL currently supratherapeutic. Will decrease rate to 1200 units/hr. Check heparin level in 6 hours     Shaley Leavens A Emya Picado 10/08/2019,7:31 PM

## 2019-10-16 NOTE — Progress Notes (Signed)
eLink Physician-Brief Progress Note Patient Name: Jodi Rice DOB: [birth year] MRN: [id number]   Date of Service  10/03/2019  HPI/Events of Note  Pt admitted to the ICU with Covid-19 pneumonia, acute respiratory insufficiency, and Diabetic Ketoacidosis.  eICU Interventions  New patient evaluation completed.        Kerry Kass [name] 10/19/2019, 6:41 AM

## 2019-10-16 NOTE — Progress Notes (Signed)
ANTICOAGULATION CONSULT NOTE - Initial Consult  Pharmacy Consult for Heparin Indication: elevated D Dimer, Covid + pt, treat as suspected PE  No Known Allergies  Patient Measurements: Height: 5\' 8"  (172.7 cm) Weight: 178 lb (80.7 kg) IBW/kg (Calculated) : 63.9 HEPARIN DW (KG): 80.1  Vital Signs: Temp: 97.7 F (36.5 C) (12/25 2057) Temp Source: Oral (12/25 2057) BP: 147/76 (12/26 0300) Pulse Rate: 128 (12/26 0330)  Labs: Recent Labs    10/14/19 1250 09/30/2019 0239  HGB 13.9 14.4  HCT 42.0 44.9  PLT 358 471*  CREATININE 0.87 1.27*  TROPONINIHS  --  20*    Estimated Creatinine Clearance: 59.1 mL/min (A) (by C-G formula based on SCr of 1.27 mg/dL (H)).  Medical History: Past Medical History:  Diagnosis Date  . Diabetes mellitus without complication (HCC)     Medications:  (Not in a hospital admission)   Assessment: Pharmacy asked to initiate and monitor Heparin for suspected PE, pt is Covid +.  No anticoagulants PTa per current med list.  Baseline labs pending.   Goal of Therapy:  Heparin level 0.3-0.7 units/ml Monitor platelets by anticoagulation protocol: Yes   Plan:  Heparin bolus 5000 units x 1 then infusion at 1300 units/hr Check heparin level in 6 hours  [place] Crane, Kavon Valenza A 10/05/2019,4:23 AM

## 2019-10-16 NOTE — Progress Notes (Signed)
At the start of AM shift insulin drip rate infusing at 18 units/hour. Next CBG was obtained at 0755, 276, [employer] maintained insulin drip at 18 units/hour. Next CBG obtained at 0855, 170, [employer] changed insulin drip to 2.6.  Flowsheets in chart were incorrect and not flowing over with accurate results. Spoke with Diabetes Coordinator. Have reset all IV pumps and programmed them to Epic, will start process and have correct drip rates flowing over at next CBG check at 0955.

## 2019-10-16 NOTE — ED Notes (Signed)
Heparin verified by Ramond Dial

## 2019-10-16 NOTE — ED Provider Notes (Signed)
[facility] Emergency Department Provider Note  ____________________________________________   First MD Initiated Contact with Patient 10/03/2019 0030     (approximate)  I have reviewed the triage vital signs and the nursing notes.   HISTORY  Chief Complaint Shortness of Breath and Emesis    HPI Jodi Rice is a 48 y.o. female with medical history as listed below who presents for evaluation of nausea.  She tested positive for COVID-19 about 5 days ago as an outpatient test on 12/21.  She presented to the ED yesterday for a complaint of nausea and shortness of breath.  She received a full evaluation and was deemed appropriate for discharge given no hypoxemia and overall generally reassuring work-up.  She was discharged with a prescription for Zofran.  At the time she was seen yesterday, both the medical record indicate and the patient confirms that she was not having shortness of breath.  She presents tonight by EMS from home with persistent but worsening symptoms.  She says that she was unable to fill her prescription because of the Christmas holiday and she came back tonight for management of her nausea.  She claims that she has had more than 20 episodes of vomiting in the last 24 hours.  She describes her symptoms as severe and nothing of GU makes them better or worse.  Additionally, she developed shortness of breath starting in the morning (not quite 24 hours ago) that she describes as severe and nothing in particular is making it better.  All of her symptoms are worse with exertion.  She says she has not been able to eat or drink anything all day.  She continues to have fevers and chills as well as a sore throat.  She denies chest pain and abdominal pain.          Past Medical History:  Diagnosis Date  . Diabetes mellitus without complication [facility])     Patient Active Problem List   Diagnosis Date Noted  . COVID-19 10/10/2019  . Type 2 diabetes  mellitus with nephropathy ([place]) 08/15/2019  . Adenomatous polyp of sigmoid colon   . Special screening for malignant neoplasms, colon   . Painful diabetic neuropathy ([place]) 07/04/2016  . Circadian rhythm sleep disorder, advanced sleep phase type 08/14/2008  . Hypersomnia 08/14/2008    Past Surgical History:  Procedure Laterality Date  . BREAST BIOPSY Right 1980's  . COLONOSCOPY WITH PROPOFOL N/A 12/16/2017   Procedure: COLONOSCOPY WITH PROPOFOL;  Surgeon: Lin Landsman, MD;  Location: [facility] ENDOSCOPY;  Service: Gastroenterology;  Laterality: N/A;  . FLEXIBLE SIGMOIDOSCOPY N/A 02/26/2018   Procedure: FLEXIBLE SIGMOIDOSCOPY;  Surgeon: Lin Landsman, MD;  Location[facility] ENDOSCOPY;  Service: Gastroenterology;  Laterality: N/A;  . FLEXIBLE SIGMOIDOSCOPY N/A 11/17/2018   Procedure: FLEXIBLE SIGMOIDOSCOPY;  Surgeon: Lin Landsman, MD;  Location: [facility] ENDOSCOPY;  Service: Gastroenterology;  Laterality: N/A;  . TIBIA FRACTURE SURGERY     both legs fractured.     Prior to Admission medications   Medication Sig Start Date End Date Taking? Authorizing Provider  aspirin EC 325 MG tablet Take by mouth.    [provider]  empagliflozin (JARDIANCE) 25 MG TABS tablet Take 25 mg by mouth daily.    [provider]  metFORMIN (GLUCOPHAGE XR) 500 MG 24 hr tablet Take 1 tablet (500 mg total) by mouth daily with breakfast. 07/12/19   Elby Beck, FNP  ondansetron (ZOFRAN ODT) 8 MG disintegrating tablet Take 1 tablet (8 mg  total) by mouth every 8 (eight) hours as needed for nausea or vomiting. 10/14/19   Arta Silence, MD  pregabalin (LYRICA) 150 MG capsule Take 1 capsule (150 mg total) by mouth 2 (two) times daily. 06/18/19   Edrick Kins, DPM  sitaGLIPtin (JANUVIA) 100 MG tablet Take 100 mg by mouth daily.    [provider]  traMADol (ULTRAM) 50 MG tablet Take 1 tablet (50 mg total) by mouth every 6 (six) hours as needed. 05/02/18   Loney Hering, MD      Allergies Patient has no known allergies.  Family History  Problem Relation Age of Onset  . Breast cancer Maternal Aunt 60  . Diabetes Mother   . Diabetes Sister   . Hypertension Sister     Social History Social History   Tobacco Use  . Smoking status: Former Smoker    Packs/day: 1.00    Years: 2.00    Pack years: 2.00    Types: Cigarettes    Quit date: 10/21/1986    Years since quitting: 33.0  . Smokeless tobacco: Never Used  Substance Use Topics  . Alcohol use: No  . Drug use: No    Review of Systems Constitutional: Fevers and chills, general malaise and weakness. Eyes: No visual changes. ENT: No sore throat. Cardiovascular: Denies chest pain. Respiratory: Acute onset shortness of breath over the last <24 hours. Gastrointestinal: Persistent severe nausea, no abdominal pain. Genitourinary: Negative for dysuria. Musculoskeletal: Generalized body aches.   Integumentary: Negative for rash. Neurological: Negative for headaches, focal weakness or numbness.   ____________________________________________   PHYSICAL EXAM:  VITAL SIGNS: ED Triage Vitals  Enc Vitals Group     BP 10/15/19 2057 130/66     Pulse Rate 10/15/19 2057 (!) 111     Resp 10/15/19 2057 20     Temp 10/15/19 2057 97.7 F (36.5 C)     Temp Source 10/15/19 2057 Oral     SpO2 10/15/19 2057 100 %     Weight 10/15/19 2055 80.7 kg (178 lb)     Height 10/15/19 2055 1.727 m (5\' 8" )     Head Circumference --      Peak Flow --      Pain Score 10/15/19 2055 10     Pain Loc --      Pain Edu? --      Excl. in [place]? --     Constitutional: Alert and oriented.  Ill-appearing. Eyes: Conjunctivae are normal.  Head: Atraumatic. Nose: No congestion/rhinnorhea. Mouth/Throat: Patient is wearing a mask. Neck: No stridor.  No meningeal signs.   Cardiovascular: Moderate tachycardia with a rate of around 130, regular rhythm. Good peripheral circulation. Grossly normal heart sounds. Respiratory:  Substantially elevated respiratory rate in the 40s with some accessory muscle usage but lung sounds are clear.  Resting O2 sat with hyperventilation is around 94% at rest on room air. Gastrointestinal: Soft and nontender. No distention.  Musculoskeletal: No lower extremity tenderness nor edema. No gross deformities of extremities. Neurologic:  Normal speech and language. No gross focal neurologic deficits are appreciated.  Skin:  Skin is warm, dry and intact. Psychiatric: Mood and affect are somewhat anxious.  ____________________________________________   LABS (all labs ordered are listed, but only abnormal results are displayed)  Labs Reviewed  LACTIC ACID, PLASMA - Abnormal; Notable for the following components:      Result Value   Lactic Acid, Venous 4.3 (*)    All other components within normal limits  CBC  WITH DIFFERENTIAL/PLATELET - Abnormal; Notable for the following components:   WBC 22.7 (*)    Platelets 471 (*)    Neutro Abs 19.6 (*)    Abs Immature Granulocytes 0.43 (*)    All other components within normal limits  COMPREHENSIVE METABOLIC PANEL - Abnormal; Notable for the following components:   Sodium 146 (*)    CO2 <7 (*)    Glucose, Bld 389 (*)    BUN 27 (*)    Creatinine, Ser 1.27 (*)    Calcium 8.7 (*)    Albumin 3.0 (*)    Total Bilirubin 2.4 (*)    GFR calc non Af Amer 49 (*)    GFR calc Af Amer 57 (*)    All other components within normal limits  FIBRIN DERIVATIVES D-DIMER ([facility] ONLY) - Abnormal; Notable for the following components:   Fibrin derivatives D-dimer ([facility]) >7,500.00 (*)    All other components within normal limits  LACTATE DEHYDROGENASE - Abnormal; Notable for the following components:   LDH 306 (*)    All other components within normal limits  FERRITIN - Abnormal; Notable for the following components:   Ferritin 331 (*)    All other components within normal limits  TRIGLYCERIDES - Abnormal; Notable for the following components:    Triglycerides 236 (*)    All other components within normal limits  FIBRINOGEN - Abnormal; Notable for the following components:   Fibrinogen >750 (*)    All other components within normal limits  BRAIN NATRIURETIC PEPTIDE - Abnormal; Notable for the following components:   B Natriuretic Peptide 118.0 (*)    All other components within normal limits  BLOOD GAS, ARTERIAL - Abnormal; Notable for the following components:   pH, Arterial 7.08 (*)    pCO2 arterial <19.0 (*)    pO2, Arterial 80 (*)    Bicarbonate 3.9 (*)    Acid-base deficit 24.0 (*)    All other components within normal limits  GLUCOSE, CAPILLARY - Abnormal; Notable for the following components:   Glucose-Capillary 375 (*)    All other components within normal limits  TROPONIN I (HIGH SENSITIVITY) - Abnormal; Notable for the following components:   Troponin I (High Sensitivity) 20 (*)    All other components within normal limits  CULTURE, BLOOD (ROUTINE X 2)  CULTURE, BLOOD (ROUTINE X 2)  CULTURE, BLOOD (ROUTINE X 2)  CULTURE, BLOOD (ROUTINE X 2)  PROCALCITONIN  C-REACTIVE PROTEIN  BETA-HYDROXYBUTYRIC ACID  HIV ANTIBODY (ROUTINE TESTING W REFLEX)  PROTIME-INR  APTT  POC URINE PREG, ED  ABO/RH   ____________________________________________  EKG  ED ECG REPORT I, Hinda Kehr, the attending physician, personally viewed and interpreted this ECG.  Date: 10/12/2019 EKG Time: 2:45 AM Rate: 127 Rhythm: Sinus tachycardia QRS Axis: normal Intervals: normal ST/T Wave abnormalities: Non-specific ST segment / T-wave changes, but no clear evidence of acute ischemia. Narrative Interpretation: no definitive evidence of acute ischemia; does not meet STEMI criteria.   ____________________________________________  RADIOLOGY I, Hinda Kehr, personally viewed and evaluated these images (plain radiographs) as part of my medical decision making, as well as reviewing the written report by the radiologist.  ED MD  interpretation:  Patchy opacities consistent with viral pneumonia on both chest x-rays.  Post line x-ray indicates appropriate placement of right IJ.   Official radiology report(s): DG Chest Portable 1 View  Result Date: 10/07/2019 CLINICAL DATA:  Central line placement. EXAM: PORTABLE CHEST 1 VIEW COMPARISON:  10/08/2019 at 12:43 a.m. FINDINGS: Interval placement  of right IJ central venous catheter with tip over the SVC. Lungs are hypoinflated with stable patchy bilateral airspace opacification over the mid to lower lungs likely multifocal pneumonia. No effusion. Cardiomediastinal silhouette and remainder of the exam is unchanged. IMPRESSION: 1. Stable patchy multifocal airspace process over the mid to lower lungs likely multifocal pneumonia. 2.  Right IJ central venous catheter with tip over the SVC. Electronically Signed   By: Marin Olp M.D.   On: 10/09/2019 03:56   DG Chest Port 1 View  Result Date: 10/02/2019 CLINICAL DATA:  Shortness of breath COVID positive EXAM: PORTABLE CHEST 1 VIEW COMPARISON:  December 20, 2011 FINDINGS: The heart size and mediastinal contours are within normal limits. Patchy airspace opacities are seen at both lower lungs. No pleural effusion. No acute osseous abnormality. IMPRESSION: Patchy airspace opacities at both lung bases, likely consistent with viral pneumonia. Electronically Signed   By: Prudencio Pair M.D.   On: 09/21/2019 01:58    ____________________________________________   PROCEDURES   Procedure(s) performed (including Critical Care):  .Critical Care Performed by: Hinda Kehr, MD Authorized by: Hinda Kehr, MD   Critical care provider statement:    Critical care time (minutes):  60   Critical care time was exclusive of:  Separately billable procedures and treating other patients   Critical care was necessary to treat or prevent imminent or life-threatening deterioration of the following conditions:  Respiratory failure   Critical care was  time spent personally by me on the following activities:  Development of treatment plan with patient or surrogate, discussions with consultants, evaluation of patient's response to treatment, examination of patient, obtaining history from patient or surrogate, ordering and performing treatments and interventions, ordering and review of laboratory studies, ordering and review of radiographic studies, pulse oximetry, re-evaluation of patient's condition and review of old charts .Central Line  Date/Time: 10/17/2019 3:39 AM Performed by: Hinda Kehr, MD Authorized by: Hinda Kehr, MD   Consent:    Consent obtained:  Emergent situation and verbal   Consent given by:  Patient   Risks discussed:  Arterial puncture, incorrect placement, pneumothorax, infection and bleeding Pre-procedure details:    Hand hygiene: Hand hygiene performed prior to insertion     Sterile barrier technique: All elements of maximal sterile technique followed     Skin preparation:  2% chlorhexidine   Skin preparation agent: Skin preparation agent completely dried prior to procedure   Anesthesia (see MAR for exact dosages):    Anesthesia method:  Local infiltration   Local anesthetic:  Lidocaine 1% w/o epi Procedure details:    Location:  R internal jugular   Patient position:  Trendelenburg   Procedural supplies:  Triple lumen   Catheter size:  7.5 Fr   Landmarks identified: yes     Ultrasound guidance: yes     Sterile ultrasound techniques: Sterile gel and sterile probe covers were used     Number of attempts:  1   Successful placement: yes   Post-procedure details:    Post-procedure:  Dressing applied and line sutured   Assessment:  Blood return through all ports, free fluid flow, no pneumothorax on x-ray and placement verified by x-ray   Patient tolerance of procedure:  Tolerated well, no immediate complications     ____________________________________________   INITIAL IMPRESSION / MDM / ASSESSMENT AND  PLAN / ED COURSE  As part of my medical decision making, I reviewed the following data within the [place] notes reviewed and incorporated,  Labs reviewed , EKG interpreted , Old chart reviewed, Radiograph reviewed , Discussed with intensivist (Dr. Lucile Shutters) and reviewed Notes from prior ED visits   Differential diagnosis includes, but is not limited to, worsening COVID-19 infection, pulmonary embolism, bacterial pneumonia superinfection, DKA, other nonspecific electrolyte or metabolic abnormality, sepsis.  The patient appears to have decompensated significantly over the last 24 hours.  Although she is currently tachycardic and tachypneic, I doubt that this represents bacterial sepsis but rather worsening COVID-19 infection and resulting electrolyte and metabolic abnormality due to dehydration.  Based on her current breathing pattern I am also concerned about the possibility of DKA although apparently she uses Metformin and not insulin and this may be less likely than just volume depletion and some lactic acidosis from her respiratory failure.  COVID-19 work-up labs are pending including procalcitonin, lactic acid, blood cultures, D-dimer, etc.  I have also asked respiratory therapy to perform an ABG given her current presentation.  Chest x-ray and labs are pending.  She may benefit from a CTA chest but I will reassess after I have more information.  Given her obvious volume depletion and reports of significant GI losses I have ordered 1 L normal saline IV bolus even though typically we do not provide a large amount of fluids to COVID-19 patients, but in this case I feel it is appropriate and clinically indicated.      Clinical Course as of Oct 15 412  Sat Oct 16, 2019  0133 Patient is profoundly acidotic.  IV access is very difficult and nurses are still working to obtain access.  Blood gas, arterial(!!) [CF]  0134 pO2, Arterial(!): 80 [CF]  0134 pCO2 arterial(!!): <19.0 [CF]    0211 Blood clotted, lab is in the room attempted to re-draw   [CF]  0238 viral (COVID-19) PNA, report not crossing over but is listed below:  CLINICAL DATA: Shortness of breath COVID positive  EXAM: PORTABLE CHEST 1 VIEW  COMPARISON: December 20, 2011  FINDINGS: The heart size and mediastinal contours are within normal limits. Patchy airspace opacities are seen at both lower lungs. No pleural effusion. No acute osseous abnormality.  IMPRESSION: Patchy airspace opacities at both lung bases, likely consistent with viral pneumonia.   Electronically Signed By: Prudencio Pair M.D. On: 10/01/2019 01:58  DG Chest Port 1 View [CF]  [phone] Lab personnel successful at drawing blood, will be watching for results.  Given the patient's obvious degree of illness, I am going to talk with her to obtain verbal consent for an emergency central line   [CF]  0335 After having the risks and benefits discussion with the patient regarding emergent central line placement I received verbal consent and successfully placed a right IJ as documented above.  When I came out I discovered the results of some of her labs including a leukocytosis of greater than 22 and lactic acid greater than 4.  Under the circumstances, which is atypical for Covid-only, I have ordered code sepsis including a total of 30 mL/kg of fluids and empiric antibiotics including ceftriaxone 2 g IV and azithromycin 500 mg IV as well as remdesivir per pharmacy consult and Decadron 10 mg IV given the multifocal pneumonia pattern on chest x-ray.    Metabolic panel still pending.   [CF]  0340 Cannot calculate anion gap based on current values, but suspect patient needs to be on insulin infusion as per DKA protocol.  Paged Dr. Mortimer Fries with the ICU to discuss, and will order DKA protocol   [CF]  [id number] Fibrin derivatives D-dimer [facility])(!): >7,500.00 [CF]  0353 Discussed case by phone with intensivist, Dr. Lucile Shutters (e-Link).  I discussed the case in detail and  explained the severity of the patient's illness.  I requested that he or his NP in the unit tonight, Marda Stalker, admit the patient to their service and manage her care given the complexity and ICU level of need, even if no ICU bed is immediately available.  He acknowledged my request and is calling Ms. Blakeney.   [CF]  0404 I sent a [facility] secure chat question to Dr. Lucile Shutters and Marda Stalker about heparin for patients with a D-dimer greater than 6000 as per the [facility] protocol and I am waiting to hear back.   [CF]  0410 Appropriate line placement  DG Chest Portable 1 View [CF]  0410 Dr. Lucile Shutters confirmed that Ms. Melene Muller is coming to see the patient.  Also said "yes" regarding heparin.  I have ordered heparin per pharmacy consult and explained the indication.   [CF]    Clinical Course User Index [CF] Hinda Kehr, MD     ____________________________________________  FINAL CLINICAL IMPRESSION(S) / ED DIAGNOSES  Final diagnoses:  Sepsis, due to unspecified organism, unspecified whether acute organ dysfunction present [facility])  Lower respiratory tract infection due to COVID-19 virus  Elevated lactic acid level  Acidosis  Diabetic ketoacidosis without coma associated with type 2 diabetes mellitus ([place])  Elevated troponin level     MEDICATIONS GIVEN DURING THIS VISIT:  Medications  lactated ringers bolus 1,500 mL (has no administration in time range)  cefTRIAXone (ROCEPHIN) 2 g in sodium chloride 0.9 % 100 mL IVPB (has no administration in time range)  azithromycin (ZITHROMAX) 500 mg in sodium chloride 0.9 % 250 mL IVPB (has no administration in time range)  dexamethasone (DECADRON) injection 10 mg (has no administration in time range)  remdesivir 200 mg in sodium chloride 0.9% 250 mL IVPB (has no administration in time range)    Followed by  remdesivir 100 mg in sodium chloride 0.9 % 100 mL IVPB (has no administration in time range)  insulin regular, human (MYXREDLIN) 100 units/  100 mL infusion (has no administration in time range)  dextrose 5 %-0.45 % sodium chloride infusion (has no administration in time range)  dextrose 50 % solution 0-50 mL (has no administration in time range)  potassium chloride 10 mEq in 100 mL IVPB (has no administration in time range)  0.9 %  sodium chloride infusion (has no administration in time range)  dexamethasone (DECADRON) injection 6 mg (has no administration in time range)  guaiFENesin-dextromethorphan (ROBITUSSIN DM) 100-10 MG/5ML syrup 10 mL (has no administration in time range)  ascorbic acid (VITAMIN C) tablet 500 mg (has no administration in time range)  zinc sulfate capsule 220 mg (has no administration in time range)  acetaminophen (TYLENOL) tablet 650 mg (has no administration in time range)  aspirin EC tablet 81 mg (has no administration in time range)  folic acid (FOLVITE) tablet 1 mg (has no administration in time range)  multivitamin with minerals tablet 1 tablet (has no administration in time range)  thiamine tablet 100 mg (has no administration in time range)  ondansetron (ZOFRAN-ODT) disintegrating tablet 4 mg (4 mg Oral Given 10/15/19 2106)  sodium chloride 0.9 % bolus 1,000 mL (1,000 mLs Intravenous New Bag/Given 10/05/2019 0155)  ondansetron (ZOFRAN) injection 4 mg (4 mg Intravenous Given 09/29/2019 0153)     ED Discharge Orders    None      *  Please note:  Minda Bajaj was evaluated in Emergency Department on 09/23/2019 for the symptoms described in the history of present illness. She was evaluated in the context of the global COVID-19 pandemic, which necessitated consideration that the patient might be at risk for infection with the SARS-CoV-2 virus that causes COVID-19. Institutional protocols and algorithms that pertain to the evaluation of patients at risk for COVID-19 are in a state of rapid change based on information released by regulatory bodies including the CDC and federal and state organizations.  These policies and algorithms were followed during the patient's care in the ED.  Some ED evaluations and interventions may be delayed as a result of limited staffing during the pandemic.*  Note:  This document was prepared using Dragon voice recognition software and may include unintentional dictation errors.   Hinda Kehr, MD 10/10/2019 [phone]

## 2019-10-16 NOTE — ED Notes (Signed)
Called lab to request phlebotomist

## 2019-10-16 NOTE — H&P (Addendum)
Name: Jodi Rice MRN: [id number] DOB: November 12, 1970    ADMISSION DATE:  10/18/2019 CONSULTATION DATE: 10/14/2019  REFERRING MD : Dr. Karma Greaser   CHIEF COMPLAINT: Shortness of Breath   BRIEF PATIENT DESCRIPTION:  48 yo female diagnosed with COVID-19 on 12/21 admitted with acute respiratory failure in the setting of COVID-19 pneumonia and metabolic acidosis due to DKA requiring insulin gtt   SIGNIFICANT EVENTS/STUDIES:  12/26: Pt admitted to ICU with COVID-19 and DKA on insulin gtt   HISTORY OF PRESENT ILLNESS:   This is a 48 yo female with PMH of Type II Diabetes Mellitus, Peripheral Neuropathy, HTN, Hyperlipidemia, and Vitamin D Deficiency.  She presented to [facility] ER on 12/26 with c/o nausea and shortness of breath.  She was diagnosed with COVID-19 in the outpatient setting on 10/11/2019.  She previously presented to [facility] ER on 10/14/19 with c/o worsening weakness and nausea/vomiting.  Lab results at that time revealed CO2 14, anion gap 23, and glucose 154.  She did not require hospital admission on 10/14/19 and was prescribed zofran, however she was unable to fill her prescription due to the holiday.  During current ER presentation lab results revealed Na+ 146, CO2 <7, glucose 389, BUN 27, creatinine 1.27, total bilirubin 2.4, LDH 306, troponin I 20, ferritin 331, lactic acid 4.3, wbc 22.7, fibrinogen >750, d-dimer >7,500, and abg ph 7.08/pCO2 <19/pO2 80/acid-base deficit 24/bicarb 3.9.  Pt ruled in for DKA, therefore insulin gtt initiated.  CXR concerning for viral pneumonia.  Pt received ceftriaxone and azithromycin.  She was subsequently admitted to ICU for additional workup and treatment.    PAST MEDICAL HISTORY :   has a past medical history of Diabetes mellitus without complication ([place]).  has a past surgical history that includes Breast biopsy (Right, 1980's); Colonoscopy with propofol (N/A, 12/16/2017); Flexible sigmoidoscopy (N/A, 02/26/2018); Flexible sigmoidoscopy (N/A,  11/17/2018); and Tibia fracture surgery. Prior to Admission medications   Medication Sig Start Date End Date Taking? Authorizing Provider  aspirin EC 325 MG tablet Take by mouth.    [provider]  empagliflozin (JARDIANCE) 25 MG TABS tablet Take 25 mg by mouth daily.    [provider]  metFORMIN (GLUCOPHAGE XR) 500 MG 24 hr tablet Take 1 tablet (500 mg total) by mouth daily with breakfast. 07/12/19   Elby Beck, FNP  ondansetron (ZOFRAN ODT) 8 MG disintegrating tablet Take 1 tablet (8 mg total) by mouth every 8 (eight) hours as needed for nausea or vomiting. 10/14/19   Arta Silence, MD  pregabalin (LYRICA) 150 MG capsule Take 1 capsule (150 mg total) by mouth 2 (two) times daily. 06/18/19   Edrick Kins, DPM  sitaGLIPtin (JANUVIA) 100 MG tablet Take 100 mg by mouth daily.    [provider]  traMADol (ULTRAM) 50 MG tablet Take 1 tablet (50 mg total) by mouth every 6 (six) hours as needed. 05/02/18   Loney Hering, MD   No Known Allergies  FAMILY HISTORY:  family history includes Breast cancer (age of onset: 42) in her maternal aunt; Diabetes in her mother and sister; Hypertension in her sister. SOCIAL HISTORY:  reports that she quit smoking about 33 years ago. Her smoking use included cigarettes. She has a 2.00 pack-year smoking history. She has never used smokeless tobacco. She reports that she does not drink alcohol or use drugs.  REVIEW OF SYSTEMS: Positives in BOLD  Constitutional: Negative for fever, chills, weight loss, malaise/fatigue and diaphoresis.  HENT: Negative for hearing loss, ear  pain, nosebleeds, congestion, sore throat, neck pain, tinnitus and ear discharge.   Eyes: Negative for blurred vision, double vision, photophobia, pain, discharge and redness.  Respiratory: cough, hemoptysis, sputum production, shortness of breath, wheezing and stridor.   Cardiovascular: Negative for chest pain, palpitations, orthopnea, claudication, leg  swelling and PND.  Gastrointestinal: heartburn, nausea, vomiting, abdominal pain, diarrhea, constipation, blood in stool and melena.  Genitourinary: Negative for dysuria, urgency, frequency, hematuria and flank pain.  Musculoskeletal: Negative for myalgias, back pain, joint pain and falls.  Skin: Negative for itching and rash.  Neurological: Negative for dizziness, tingling, tremors, sensory change, speech change, focal weakness, seizures, loss of consciousness, weakness and headaches.  Endo/Heme/Allergies: Negative for environmental allergies and polydipsia. Does not bruise/bleed easily.  SUBJECTIVE:  No complaints at this time   VITAL SIGNS: Temp:  [97.7 F (36.5 C)] 97.7 F (36.5 C) (12/25 2057) Pulse Rate:  [111-132] 128 (12/26 0330) Resp:  [20-50] 34 (12/26 0330) BP: (130-167)/(66-89) 147/76 (12/26 0300) SpO2:  [91 %-100 %] 92 % (12/26 0330) Weight:  [80.7 kg] 80.7 kg (12/25 2055)  PHYSICAL EXAMINATION: General: acutely ill appearing female, in mild respiratory distress  Neuro: alert and oriented, follows commands  HEENT: supple, no JVD  Cardiovascular: sinus tachycardia, no R/G Lungs: faint rhonchi throughout, slightly labored and tachypneic  Abdomen: +BS x4, soft, non tender, non distended  Musculoskeletal: normal bulk and tone, no edema  Skin: intact no rashes or lesions present   Recent Labs  Lab 10/14/19 1250 10/01/2019 0239  NA 144 146*  K 4.2 4.2  CL 107 107  CO2 14* <7*  BUN 16 27*  CREATININE 0.87 1.27*  GLUCOSE 154* 389*   Recent Labs  Lab 10/14/19 1250 10/11/2019 0239  HGB 13.9 14.4  HCT 42.0 44.9  WBC 9.3 22.7*  PLT 358 471*   DG Chest Portable 1 View  Result Date: 10/13/2019 CLINICAL DATA:  Central line placement. EXAM: PORTABLE CHEST 1 VIEW COMPARISON:  09/22/2019 at 12:43 a.m. FINDINGS: Interval placement of right IJ central venous catheter with tip over the SVC. Lungs are hypoinflated with stable patchy bilateral airspace opacification over the  mid to lower lungs likely multifocal pneumonia. No effusion. Cardiomediastinal silhouette and remainder of the exam is unchanged. IMPRESSION: 1. Stable patchy multifocal airspace process over the mid to lower lungs likely multifocal pneumonia. 2.  Right IJ central venous catheter with tip over the SVC. Electronically Signed   By: Marin Olp M.D.   On: 10/05/2019 03:56   DG Chest Port 1 View  Result Date: 09/25/2019 CLINICAL DATA:  Shortness of breath COVID positive EXAM: PORTABLE CHEST 1 VIEW COMPARISON:  December 20, 2011 FINDINGS: The heart size and mediastinal contours are within normal limits. Patchy airspace opacities are seen at both lower lungs. No pleural effusion. No acute osseous abnormality. IMPRESSION: Patchy airspace opacities at both lung bases, likely consistent with viral pneumonia. Electronically Signed   By: Prudencio Pair M.D.   On: 10/04/2019 01:58    ASSESSMENT / PLAN:  Acute respiratory failure secondary to COVID-19 pneumonia and metabolic acidosis in the setting of DKA  Prn supplemental O2 for dyspnea and/or hypoxia  Continue decadron and remdesivir  Will start vitamin C, mvi, thiamine, zinc, and folic acid  Prn bronchodilator therapy   Trend inflammatory markers  If pt hypoxic will instruct to self-prone as tolerated Pulmonary hygiene  Maintain airborne and contact precautions  Trend WBC and monitor fever curve  Trend PCT  Follow cultures Continue azithromycin and ceftriaxone  for now   Acute renal failure secondary to hypovolemia in setting of DKA  Lactic acidosis  Trend BMP and lactic acid  Replace electrolytes as indicated  Monitor UOP Avoid nephrotoxic medications   Diabetic ketoacidosis  Hx: peripheral neuropathy  Continue insulin gtt until anion gap closed and serum CO2 >20 IV fluids per DKA protocol  BMP q4hrs and CBG q1hr while on insulin gtt  Diabetes coordinator consulted appreciate input  Continue outpatient lyrica   Best Practice: VTE px:  heparin gtt  SUP px: iv pepcid Diet: NPO for now while on insulin gtt   Marda Stalker, [place] Pager [phone] (please enter 7 digits) PCCM Consult Pager [phone] (please enter 7 digits)

## 2019-10-16 NOTE — Progress Notes (Signed)
Remdesivir - Pharmacy Brief Note   O:  ALT: 17 CXR: evidence of infection SpO2: 92% on RA   A/P:  Remdesivir 200 mg IVPB once followed by 100 mg IVPB daily x 4 days.   Hart Robinsons, PharmD 10/12/2019 3:45 AM

## 2019-10-16 NOTE — Progress Notes (Signed)
Inpatient Diabetes Program Recommendations  AACE/ADA: New Consensus Statement on Inpatient Glycemic Control   Target Ranges:  Prepandial:   less than 140 mg/dL      Peak postprandial:   less than 180 mg/dL (1-2 hours)      Critically ill patients:  140 - 180 mg/dL   Results for Jodi Rice, Jodi Rice (MRN [id number]) as of 10/19/2019 06:08  Ref. Range 10/10/2019 02:39  CO2 Latest Ref Range: 22 - 32 mmol/L <7 (L)  Glucose Latest Ref Range: 70 - 99 mg/dL 389 (H)  Results for Jodi Rice, Jodi Rice (MRN [id number]) as of 10/08/2019 06:08  Ref. Range 09/21/2019 02:39  Anion gap Latest Ref Range: 5 - 15  NOT CALCULATED  Results for Jodi Rice, Jodi Rice (MRN [id number]) as of 10/11/2019 06:08  Ref. Range 10/03/2019 02:39  Beta-Hydroxybutyric Acid Latest Ref Range: 0.05 - 0.27 mmol/L >8.00 (H)  Results for Jodi Rice, Jodi Rice (MRN [id number]) as of 10/05/2019 06:08  Ref. Range 06/07/2019 00:00  Hemoglobin A1C Unknown 10.7   Review of Glycemic Control  Admitted with: COVID, DKA, Acute Respiratory failure secondary to COVID, pneumonia and metabolic acidosis Diabetes history: DM2 Outpatient Diabetes medications: Jardiance 25 mg daily, Metformin XR 500 mg QAM, Januvia 100 mg daily Current orders for Inpatient glycemic control: IV insulin; Dexadron 6 mg Q24H  Inpatient Diabetes Program Recommendations:   Insulin - IV drip/EndoTool: Patient admitted with DKA (labs 10/05/2019 @2 :30 with CO2 <7, AG not calculated, glucose 389 mg/dl, and Beta-hydroxybutyrate >8.0). IV insulin should be continued until acidosis is resolved.  HbgA1C: Please consider ordering an A1C to evaluate glycemic control over the past 2-3 months.  NOTE: Noted consult for Diabetes Coordinator. Diabetes Coordinator is not on campus over the weekend but available by pager from 8am to 5pm for questions or concerns. Chart reviewed. Noted patient presented to Emergency Room on 10/14/19 with N/V, weakness, COVID (dx 10/11/19). Labs on  10/14/19 @ 12:50 of glucose of 154, CO2 14, AG 23. Patient was discharged from ER on 10/14/19 and presented back on 10/15/19 with worsening symptoms (N/V - more than 20 episodes in 24 hours, shortness of breath, weakness). Labs on 10/11/2019 @2 :39 am with CO2<7, AG not calculated, glucose 389, and Beta-hydroxybutyrate >8.0.  In reviewing chart, noted patient sees [facility] (last seen Renae Gloss, NP on 06/07/19). A1C 10.7% on 06/07/19. Per office visit note on 06/07/19, patient was dx with DM2 over 20 years ago, was only taking Jardiance 25 mg daily and was instructed to continue Jardiance and restart Januvia 100 mg daily and Metformin 500 mg BID. Also noted that patient has taken Novolog insulin in the past as needed for hyperglycemia. Patient is currently ordered IV insulin which will need to be continued until acidosis is resolved. At discharge, may want to consider discontinuing Jardiance due to risk of DKA. Patient may need to be prescribed insulin at discharge for glycemic control.   Thanks, Barnie Alderman, RN, MSN, CDE Diabetes Coordinator Inpatient Diabetes Program [phone] (Team Pager from 8am to 5pm)

## 2019-10-16 NOTE — Consult Note (Addendum)
PHARMACY CONSULT NOTE - FOLLOW UP  Pharmacy Consult for Electrolyte Monitoring and Replacement   Recent Labs: Potassium (mmol/L)  Date Value  10/20/2019 3.3 (L)  12/21/2011 3.5   Calcium (mg/dL)  Date Value  10/13/2019 7.8 (L)   Calcium, Total (mg/dL)  Date Value  12/21/2011 8.0 (L)   Albumin (g/dL)  Date Value  10/04/2019 3.0 (L)  12/20/2011 4.2   Sodium  Date Value  10/03/2019 146 mmol/L (H)  12/31/2018 133 (A)  12/21/2011 143 mmol/L     Assessment: 48 year old female admitted with shortness of breath and emesis with COVID-19 pneumonia and DKA.  She tested positive for COVID-19 5 days prior.  In the ED, she reported vomiting with 20 episodes in a 24 hour period.  Corrected Calcium:  8.2  Goal of Therapy:  All electrolytes wnl  Plan:  Patient received 2 prior doses of potassium 10 mEq q1 hour x 2 doses at 4:30 and 5:30 AM on 12/26.  K checked at 0900 likely was too early to reflect absorption, but will give additional potassium 20 mEq x 1 dose. Will recheck electrolytes in the AM.  Gerald Dexter, PharmD Pharmacy Resident  10/15/2019 1:29 PM

## 2019-10-16 NOTE — Progress Notes (Signed)
Notified bedside nurse of need to draw repeat lactic acid. 

## 2019-10-16 NOTE — ED Notes (Signed)
Xray tech at bedside.

## 2019-10-17 LAB — CBC WITH DIFFERENTIAL/PLATELET
Abs Immature Granulocytes: 0.33 10*3/uL — ABNORMAL HIGH (ref 0.00–0.07)
Basophils Absolute: 0.1 10*3/uL (ref 0.0–0.1)
Basophils Relative: 0 %
Eosinophils Absolute: 0 10*3/uL (ref 0.0–0.5)
Eosinophils Relative: 0 %
HCT: 35.5 % — ABNORMAL LOW (ref 36.0–46.0)
Hemoglobin: 12.1 g/dL (ref 12.0–15.0)
Immature Granulocytes: 1 %
Lymphocytes Relative: 7 %
Lymphs Abs: 1.7 10*3/uL (ref 0.7–4.0)
MCH: 28.8 pg (ref 26.0–34.0)
MCHC: 34.1 g/dL (ref 30.0–36.0)
MCV: 84.5 fL (ref 80.0–100.0)
Monocytes Absolute: 0.6 10*3/uL (ref 0.1–1.0)
Monocytes Relative: 2 %
Neutro Abs: 20.5 10*3/uL — ABNORMAL HIGH (ref 1.7–7.7)
Neutrophils Relative %: 90 %
Platelets: 432 10*3/uL — ABNORMAL HIGH (ref 150–400)
RBC: 4.2 MIL/uL (ref 3.87–5.11)
RDW: 13.8 % (ref 11.5–15.5)
WBC: 23.1 10*3/uL — ABNORMAL HIGH (ref 4.0–10.5)
nRBC: 0 % (ref 0.0–0.2)

## 2019-10-17 LAB — BASIC METABOLIC PANEL
Anion gap: 10 (ref 5–15)
BUN: 26 mg/dL — ABNORMAL HIGH (ref 6–20)
CO2: 20 mmol/L — ABNORMAL LOW (ref 22–32)
Calcium: 8 mg/dL — ABNORMAL LOW (ref 8.9–10.3)
Chloride: 114 mmol/L — ABNORMAL HIGH (ref 98–111)
Creatinine, Ser: 0.83 mg/dL (ref 0.44–1.00)
GFR calc Af Amer: >60 mL/min (ref >60)
GFR calc non Af Amer: >60 mL/min (ref >60)
Glucose, Bld: 160 mg/dL — ABNORMAL HIGH (ref 70–99)
Potassium: 3.6 mmol/L (ref 3.5–5.1)
Sodium: 144 mmol/L (ref 135–145)

## 2019-10-17 LAB — COMPREHENSIVE METABOLIC PANEL
ALT: 10 U/L (ref 0–44)
AST: 21 U/L (ref 15–41)
Albumin: 2.2 g/dL — ABNORMAL LOW (ref 3.5–5.0)
Alkaline Phosphatase: 76 U/L (ref 38–126)
Anion gap: 10 (ref 5–15)
BUN: 25 mg/dL — ABNORMAL HIGH (ref 6–20)
CO2: 20 mmol/L — ABNORMAL LOW (ref 22–32)
Calcium: 7.9 mg/dL — ABNORMAL LOW (ref 8.9–10.3)
Chloride: 113 mmol/L — ABNORMAL HIGH (ref 98–111)
Creatinine, Ser: 0.71 mg/dL (ref 0.44–1.00)
GFR calc Af Amer: >60 mL/min (ref >60)
GFR calc non Af Amer: >60 mL/min (ref >60)
Glucose, Bld: 172 mg/dL — ABNORMAL HIGH (ref 70–99)
Potassium: 3.8 mmol/L (ref 3.5–5.1)
Sodium: 143 mmol/L (ref 135–145)
Total Bilirubin: 0.8 mg/dL (ref 0.3–1.2)
Total Protein: 6 g/dL — ABNORMAL LOW (ref 6.5–8.1)

## 2019-10-17 LAB — FERRITIN: Ferritin: 315 ng/mL — ABNORMAL HIGH (ref 11–307)

## 2019-10-17 LAB — MAGNESIUM: Magnesium: 2.3 mg/dL (ref 1.7–2.4)

## 2019-10-17 LAB — HEPARIN LEVEL (UNFRACTIONATED)
Heparin Unfractionated: 0.91 [IU]/mL — ABNORMAL HIGH (ref 0.30–0.70)
Heparin Unfractionated: 0.93 IU/mL — ABNORMAL HIGH (ref 0.30–0.70)
Heparin Unfractionated: 0.23 IU/mL — ABNORMAL LOW (ref 0.30–0.70)

## 2019-10-17 LAB — GLUCOSE, CAPILLARY
Glucose-Capillary: 346 mg/dL — ABNORMAL HIGH (ref 70–99)
Glucose-Capillary: 148 mg/dL — ABNORMAL HIGH (ref 70–99)
Glucose-Capillary: 144 mg/dL — ABNORMAL HIGH (ref 70–99)
Glucose-Capillary: 167 mg/dL — ABNORMAL HIGH (ref 70–99)
Glucose-Capillary: 158 mg/dL — ABNORMAL HIGH (ref 70–99)
Glucose-Capillary: 224 mg/dL — ABNORMAL HIGH (ref 70–99)
Glucose-Capillary: 213 mg/dL — ABNORMAL HIGH (ref 70–99)
Glucose-Capillary: 151 mg/dL — ABNORMAL HIGH (ref 70–99)
Glucose-Capillary: 177 mg/dL — ABNORMAL HIGH (ref 70–99)

## 2019-10-17 LAB — FIBRIN DERIVATIVES D-DIMER (ARMC ONLY): Fibrin derivatives D-dimer (ARMC): 5201.65 ng/mL (FEU) — ABNORMAL HIGH (ref 0.00–499.00)

## 2019-10-17 LAB — C-REACTIVE PROTEIN: CRP: 16.5 mg/dL — ABNORMAL HIGH (ref <1.0)

## 2019-10-17 LAB — PHOSPHORUS: Phosphorus: 1.6 mg/dL — ABNORMAL LOW (ref 2.5–4.6)

## 2019-10-17 MED ORDER — HEPARIN BOLUS VIA INFUSION
1200.0000 [IU] | Freq: Once | INTRAVENOUS | Status: AC
  Administered 2019-10-17: 1200 [IU] via INTRAVENOUS
  Filled 2019-10-17: qty 1200

## 2019-10-17 MED ORDER — TRAZODONE HCL 50 MG PO TABS
50.0000 mg | ORAL_TABLET | Freq: Every evening | ORAL | Status: DC | PRN
  Administered 2019-10-18 – 2019-10-20 (×2): 50 mg via ORAL
  Filled 2019-10-17 (×2): qty 1

## 2019-10-17 MED ORDER — LACTATED RINGERS IV BOLUS
1000.0000 mL | Freq: Once | INTRAVENOUS | Status: AC
  Administered 2019-10-17: 1000 mL via INTRAVENOUS

## 2019-10-17 MED ORDER — INSULIN ASPART 100 UNIT/ML ~~LOC~~ SOLN
1.0000 [IU] | SUBCUTANEOUS | Status: DC
  Administered 2019-10-17: 2 [IU] via SUBCUTANEOUS
  Administered 2019-10-17: 3 [IU] via SUBCUTANEOUS
  Administered 2019-10-17: 1 [IU] via SUBCUTANEOUS
  Administered 2019-10-17: 12:00:00 3 [IU] via SUBCUTANEOUS
  Administered 2019-10-18: 2 [IU] via SUBCUTANEOUS
  Filled 2019-10-17 (×5): qty 1

## 2019-10-17 MED ORDER — INSULIN DETEMIR 100 UNIT/ML ~~LOC~~ SOLN
5.0000 [IU] | Freq: Two times a day (BID) | SUBCUTANEOUS | Status: DC
  Administered 2019-10-17 – 2019-10-20 (×8): 5 [IU] via SUBCUTANEOUS
  Filled 2019-10-17 (×9): qty 0.05

## 2019-10-17 MED ORDER — POTASSIUM PHOSPHATES 15 MMOLE/5ML IV SOLN
20.0000 mmol | Freq: Once | INTRAVENOUS | Status: AC
  Administered 2019-10-17: 11:00:00 20 mmol via INTRAVENOUS
  Filled 2019-10-17: qty 6.67

## 2019-10-17 MED ORDER — PHENOL 1.4 % MT LIQD
1.0000 | OROMUCOSAL | Status: DC | PRN
  Administered 2019-10-18: 1 via OROMUCOSAL
  Filled 2019-10-17 (×2): qty 177

## 2019-10-17 NOTE — Progress Notes (Signed)
ANTICOAGULATION CONSULT NOTE -   Pharmacy Consult for Heparin Indication: elevated D Dimer, Covid + pt, treat as suspected PE  No Known Allergies  Patient Measurements: Height: 5\' 8"  (172.7 cm) Weight: 178 lb (80.7 kg) IBW/kg (Calculated) : 63.9 HEPARIN DW (KG): 80.1  Vital Signs: Temp: 99.7 F (37.6 C) (12/26 2200) Temp Source: Core (12/26 1600) BP: 93/63 (12/27 0200) Pulse Rate: 88 (12/27 0200)  Labs: Recent Labs    10/14/19 1250 10/07/2019 0239 10/17/2019 0746 10/04/2019 1258 10/10/2019 1649 09/22/2019 1854 10/10/2019 2043 10/17/19 0105 10/17/19 0230  HGB 13.9 14.4  --   --   --   --   --   --   --   HCT 42.0 44.9  --   --   --   --   --   --   --   PLT 358 471*  --   --   --   --   --   --   --   APTT  --   --  92*  --   --   --   --   --   --   LABPROT  --   --  14.3  --   --   --   --   --   --   INR  --   --  1.1  --   --   --   --   --   --   HEPARINUNFRC  --   --   --  0.51  --  0.79*  --   --  0.91*  CREATININE 0.87 1.27*  --  0.91 0.93  --  0.79 0.83  --   TROPONINIHS  --  20*  --   --   --   --   --   --   --     Estimated Creatinine Clearance: 90.4 mL/min (by C-G formula based on SCr of 0.83 mg/dL).  Medical History: Past Medical History:  Diagnosis Date  . Diabetes mellitus without complication (HCC)     Medications:  Medications Prior to Admission  Medication Sig Dispense Refill Last Dose  . empagliflozin (JARDIANCE) 25 MG TABS tablet Take 25 mg by mouth daily.   Past Week at Unknown time  . metFORMIN (GLUCOPHAGE XR) 500 MG 24 hr tablet Take 1 tablet (500 mg total) by mouth daily with breakfast. 90 tablet 1 Past Week at Unknown time  . ondansetron (ZOFRAN ODT) 8 MG disintegrating tablet Take 1 tablet (8 mg total) by mouth every 8 (eight) hours as needed for nausea or vomiting. 15 tablet 0 Past Week at Unknown time  . pregabalin (LYRICA) 150 MG capsule Take 1 capsule (150 mg total) by mouth 2 (two) times daily. 60 capsule 1 Past Week at Unknown time  .  sitaGLIPtin (JANUVIA) 100 MG tablet Take 100 mg by mouth daily.   Past Week at Unknown time  . traMADol (ULTRAM) 50 MG tablet Take 1 tablet (50 mg total) by mouth every 6 (six) hours as needed. (Patient not taking: Reported on 10/02/2019) 12 tablet 0 Completed Course at Unknown time    Assessment: Pharmacy asked to initiate and monitor Heparin for suspected PE, pt is Covid +.  No anticoagulants PTa per current med list.  Baseline labs pending.   12/26 1258 HL= 0.51. Therapeutic, continue rate of 1300 units/hr 12/26 1854 HL = 0.79. Supratherapeutic 12/27 0230 HL = 0.91, Supratherapeutic despite rate decrease earlier.   Goal of Therapy:  Heparin level 0.3-0.7 units/ml Monitor platelets by anticoagulation protocol: Yes   Plan:  HL currently supratherapeutic. Will decrease rate to 1050 units/hr. Check heparin level in 6 hours  Jodi Rice, Anglea Gordner A 10/17/2019,2:59 AM

## 2019-10-17 NOTE — Progress Notes (Signed)
ANTICOAGULATION CONSULT NOTE -   Pharmacy Consult for Heparin Indication: elevated D Dimer, Covid + pt, treat as suspected PE  No Known Allergies  Patient Measurements: Height: 5\' 8"  (172.7 cm) Weight: 178 lb (80.7 kg) IBW/kg (Calculated) : 63.9 HEPARIN DW (KG): 80.1  Vital Signs: Temp: 97.5 F (36.4 C) (12/27 1215) Temp Source: Oral (12/27 1215) BP: 165/96 (12/27 1215) Pulse Rate: 129 (12/27 1215)  Labs: Recent Labs    09/25/2019 0239 10/13/2019 0746 10/10/2019 0907 10/08/2019 1854 10/07/2019 2043 10/17/19 0105 10/17/19 0230 10/17/19 0535 10/17/19 0910  HGB 14.4  --   --   --   --   --   --  12.1  --   HCT 44.9  --   --   --   --   --   --  35.5*  --   PLT 471*  --   --   --   --   --   --  432*  --   APTT  --  92*  --   --   --   --   --   --   --   LABPROT  --  14.3  --   --   --   --   --   --   --   INR  --  1.1  --   --   --   --   --   --   --   HEPARINUNFRC  --   --    < > 0.79*  --   --  0.91*  --  0.93*  CREATININE 1.27*  --   --   --  0.79 0.83  --  0.71  --   TROPONINIHS 20*  --   --   --   --   --   --   --   --    < > = values in this interval not displayed.    Estimated Creatinine Clearance: 93.8 mL/min (by C-G formula based on SCr of 0.71 mg/dL).  Medical History: Past Medical History:  Diagnosis Date  . Diabetes mellitus without complication (HCC)     Medications:  Medications Prior to Admission  Medication Sig Dispense Refill Last Dose  . empagliflozin (JARDIANCE) 25 MG TABS tablet Take 25 mg by mouth daily.   Past Week at Unknown time  . metFORMIN (GLUCOPHAGE XR) 500 MG 24 hr tablet Take 1 tablet (500 mg total) by mouth daily with breakfast. 90 tablet 1 Past Week at Unknown time  . ondansetron (ZOFRAN ODT) 8 MG disintegrating tablet Take 1 tablet (8 mg total) by mouth every 8 (eight) hours as needed for nausea or vomiting. 15 tablet 0 Past Week at Unknown time  . pregabalin (LYRICA) 150 MG capsule Take 1 capsule (150 mg total) by mouth 2 (two) times  daily. 60 capsule 1 Past Week at Unknown time  . sitaGLIPtin (JANUVIA) 100 MG tablet Take 100 mg by mouth daily.   Past Week at Unknown time  . traMADol (ULTRAM) 50 MG tablet Take 1 tablet (50 mg total) by mouth every 6 (six) hours as needed. (Patient not taking: Reported on 10/12/2019) 12 tablet 0 Completed Course at Unknown time    Assessment: Pharmacy asked to initiate and monitor Heparin for suspected PE, pt is Covid +.  No anticoagulants PTa per current med list.  Baseline labs pending.   12/26 1258 HL= 0.51. Therapeutic, continue rate of 1300 units/hr 12/26 1854 HL = 0.79. Supratherapeutic  12/27 0230 HL = 0.91, Supratherapeutic despite rate decrease earlier. 12/27 0910 HL= 0.93   Goal of Therapy:  Heparin level 0.3-0.7 units/ml Monitor platelets by anticoagulation protocol: Yes   Plan:  12/27- HL from 0900 was not available at 1000 and lab had note that sample sent was not correctly labeled. Dr. Mortimer Fries entered a new HL order at 1046 for once? (which I changed to once-stat)  subsquently HL now reported as from 0910? HL= 0.93. HL currently supratherapeutic. Will decrease rate to 900 units/hr and Mining engineer. Check heparin level in 6 hours  Vivaan Helseth A 10/17/2019,1:06 PM

## 2019-10-17 NOTE — Consult Note (Signed)
PHARMACY CONSULT NOTE - FOLLOW UP  Pharmacy Consult for Electrolyte Monitoring and Replacement   Recent Labs: Potassium (mmol/L)  Date Value  10/17/2019 3.8  12/21/2011 3.5   Magnesium (mg/dL)  Date Value  10/17/2019 2.3   Calcium (mg/dL)  Date Value  10/17/2019 7.9 (L)   Calcium, Total (mg/dL)  Date Value  12/21/2011 8.0 (L)   Albumin (g/dL)  Date Value  10/17/2019 2.2 (L)  12/20/2011 4.2   Phosphorus (mg/dL)  Date Value  10/17/2019 1.6 (L)   Sodium  Date Value  10/17/2019 143 mmol/L  12/31/2018 133 (A)  12/21/2011 143 mmol/L     Assessment: 47 year old female admitted with shortness of breath and emesis with COVID-19 pneumonia and DKA.  She tested positive for COVID-19 5 days prior.  In the ED, she reported vomiting with 20 episodes in a 24 hour period.  Albumin Corrected Calcium:  9.3  Goal of Therapy:  All electrolytes wnl  Plan:  12/27 K 3.8  Mag 2.3  Phos 1.6  Scr 0.71 Will order Potassium Phosphate 20 mmol IV x 1 Will recheck electrolytes in the AM.  Jodi Rice PharmD Clinical Pharmacist 10/17/2019

## 2019-10-17 NOTE — Progress Notes (Signed)
Family Update:  I had received calls from various members of this patient's family who were able to verify their identity using the patient's password. I answered all questions and gave the family updates to the patient's plan of care.

## 2019-10-17 NOTE — Progress Notes (Signed)
eLink Physician-Brief Progress Note Patient Name: Jodi Rice DOB: 30-Mar-1971 MRN: [id number]   Date of Service  10/17/2019  HPI/Events of Note  PT meets criteria for transition from Insulin infusion  eICU Interventions  Transition orders to sensitive coverage Insulin scale orders entered.        Kerry Kass Nateisha Moyd 10/17/2019, 2:29 AM

## 2019-10-17 NOTE — Progress Notes (Signed)
ANTICOAGULATION CONSULT NOTE -   Pharmacy Consult for Heparin Indication: elevated D Dimer, Covid + pt, treat as suspected PE  No Known Allergies  Patient Measurements: Height: 5\' 8"  (172.7 cm) Weight: 178 lb (80.7 kg) IBW/kg (Calculated) : 63.9 HEPARIN DW (KG): 80.1  Vital Signs: Temp: (P) 98 F (36.7 C) (12/27 2000) Temp Source: (P) Oral (12/27 2000) BP: 110/72 (12/27 1900) Pulse Rate: 94 (12/27 1900)  Labs: Recent Labs    10/07/2019 0239 09/29/2019 0746 09/30/2019 0907 10/18/2019 2043 10/17/19 0105 10/17/19 0230 10/17/19 0535 10/17/19 0910 10/17/19 2009  HGB 14.4  --   --   --   --   --  12.1  --   --   HCT 44.9  --   --   --   --   --  35.5*  --   --   PLT 471*  --   --   --   --   --  432*  --   --   APTT  --  92*  --   --   --   --   --   --   --   LABPROT  --  14.3  --   --   --   --   --   --   --   INR  --  1.1  --   --   --   --   --   --   --   HEPARINUNFRC  --   --    < >  --   --  0.91*  --  0.93* 0.23*  CREATININE 1.27*  --   --  0.79 0.83  --  0.71  --   --   TROPONINIHS 20*  --   --   --   --   --   --   --   --    < > = values in this interval not displayed.    Estimated Creatinine Clearance: 93.8 mL/min (by C-G formula based on SCr of 0.71 mg/dL).  Medical History: Past Medical History:  Diagnosis Date  . Diabetes mellitus without complication (HCC)     Medications:  Medications Prior to Admission  Medication Sig Dispense Refill Last Dose  . empagliflozin (JARDIANCE) 25 MG TABS tablet Take 25 mg by mouth daily.   Past Week at Unknown time  . metFORMIN (GLUCOPHAGE XR) 500 MG 24 hr tablet Take 1 tablet (500 mg total) by mouth daily with breakfast. 90 tablet 1 Past Week at Unknown time  . ondansetron (ZOFRAN ODT) 8 MG disintegrating tablet Take 1 tablet (8 mg total) by mouth every 8 (eight) hours as needed for nausea or vomiting. 15 tablet 0 Past Week at Unknown time  . pregabalin (LYRICA) 150 MG capsule Take 1 capsule (150 mg total) by mouth 2 (two)  times daily. 60 capsule 1 Past Week at Unknown time  . sitaGLIPtin (JANUVIA) 100 MG tablet Take 100 mg by mouth daily.   Past Week at Unknown time  . traMADol (ULTRAM) 50 MG tablet Take 1 tablet (50 mg total) by mouth every 6 (six) hours as needed. (Patient not taking: Reported on 10/09/2019) 12 tablet 0 Completed Course at Unknown time    Assessment: Pharmacy asked to initiate and monitor Heparin for suspected PE, pt is Covid +.  No anticoagulants PTa per current med list.  Baseline labs pending.   12/26 1258 HL= 0.51. Therapeutic, continue rate of 1300 units/hr 12/26 1854 HL =  0.79. Supratherapeutic 12/27 0230 HL = 0.91, Supratherapeutic despite rate decrease earlier. 12/27 0910 HL= 0.93 HL from 0900 was not available at 1000 and lab had note that sample sent was not correctly labeled. Dr. Mortimer Fries entered a new HL order at 1046 for once? (which pharmacy changed to once-stat) subsquently HL now reported as from 0910?  12/27 2009 HL = 0.23 Subtherapeutic   Goal of Therapy:  Heparin level 0.3-0.7 units/ml Monitor platelets by anticoagulation protocol: Yes   Plan:  Currently subtherapeutic. Will bolus with 1200 units x 1 and increase rate to 1000 units/hr and Mining engineer. Check heparin level in 6 hours  Adysson Revelle A Hattye Siegfried 10/17/2019,9:43 PM

## 2019-10-18 DIAGNOSIS — E1110 Type 2 diabetes mellitus with ketoacidosis without coma: Secondary | ICD-10-CM

## 2019-10-18 LAB — CBC WITH DIFFERENTIAL/PLATELET
Abs Immature Granulocytes: 0.15 10*3/uL — ABNORMAL HIGH (ref 0.00–0.07)
Basophils Absolute: 0 10*3/uL (ref 0.0–0.1)
Basophils Relative: 0 %
Eosinophils Absolute: 0 10*3/uL (ref 0.0–0.5)
Eosinophils Relative: 0 %
HCT: 33.7 % — ABNORMAL LOW (ref 36.0–46.0)
Hemoglobin: 11.7 g/dL — ABNORMAL LOW (ref 12.0–15.0)
Immature Granulocytes: 1 %
Lymphocytes Relative: 9 %
Lymphs Abs: 1.5 10*3/uL (ref 0.7–4.0)
MCH: 29 pg (ref 26.0–34.0)
MCHC: 34.7 g/dL (ref 30.0–36.0)
MCV: 83.4 fL (ref 80.0–100.0)
Monocytes Absolute: 0.7 10*3/uL (ref 0.1–1.0)
Monocytes Relative: 4 %
Neutro Abs: 14.4 10*3/uL — ABNORMAL HIGH (ref 1.7–7.7)
Neutrophils Relative %: 86 %
Platelets: 453 10*3/uL — ABNORMAL HIGH (ref 150–400)
RBC: 4.04 MIL/uL (ref 3.87–5.11)
RDW: 13.8 % (ref 11.5–15.5)
WBC: 16.7 10*3/uL — ABNORMAL HIGH (ref 4.0–10.5)
nRBC: 0 % (ref 0.0–0.2)

## 2019-10-18 LAB — COMPREHENSIVE METABOLIC PANEL
ALT: 11 U/L (ref 0–44)
AST: 17 U/L (ref 15–41)
Albumin: 2.1 g/dL — ABNORMAL LOW (ref 3.5–5.0)
Alkaline Phosphatase: 75 U/L (ref 38–126)
Anion gap: 7 (ref 5–15)
BUN: 16 mg/dL (ref 6–20)
CO2: 22 mmol/L (ref 22–32)
Calcium: 7.7 mg/dL — ABNORMAL LOW (ref 8.9–10.3)
Chloride: 110 mmol/L (ref 98–111)
Creatinine, Ser: 0.53 mg/dL (ref 0.44–1.00)
GFR calc Af Amer: >60 mL/min (ref >60)
GFR calc non Af Amer: >60 mL/min (ref >60)
Glucose, Bld: 176 mg/dL — ABNORMAL HIGH (ref 70–99)
Potassium: 3.2 mmol/L — ABNORMAL LOW (ref 3.5–5.1)
Sodium: 139 mmol/L (ref 135–145)
Total Bilirubin: 0.5 mg/dL (ref 0.3–1.2)
Total Protein: 5.6 g/dL — ABNORMAL LOW (ref 6.5–8.1)

## 2019-10-18 LAB — GLUCOSE, CAPILLARY
Glucose-Capillary: 156 mg/dL — ABNORMAL HIGH (ref 70–99)
Glucose-Capillary: 196 mg/dL — ABNORMAL HIGH (ref 70–99)
Glucose-Capillary: 210 mg/dL — ABNORMAL HIGH (ref 70–99)
Glucose-Capillary: 257 mg/dL — ABNORMAL HIGH (ref 70–99)
Glucose-Capillary: 259 mg/dL — ABNORMAL HIGH (ref 70–99)
Glucose-Capillary: 264 mg/dL — ABNORMAL HIGH (ref 70–99)

## 2019-10-18 LAB — HEPARIN LEVEL (UNFRACTIONATED)
Heparin Unfractionated: 0.21 IU/mL — ABNORMAL LOW (ref 0.30–0.70)
Heparin Unfractionated: 0.33 IU/mL (ref 0.30–0.70)

## 2019-10-18 LAB — FIBRIN DERIVATIVES D-DIMER (ARMC ONLY): Fibrin derivatives D-dimer (ARMC): 2389.32 ng/mL (FEU) — ABNORMAL HIGH (ref 0.00–499.00)

## 2019-10-18 LAB — PHOSPHORUS: Phosphorus: 1.3 mg/dL — ABNORMAL LOW (ref 2.5–4.6)

## 2019-10-18 LAB — MAGNESIUM: Magnesium: 2 mg/dL (ref 1.7–2.4)

## 2019-10-18 LAB — C-REACTIVE PROTEIN: CRP: 8 mg/dL — ABNORMAL HIGH (ref <1.0)

## 2019-10-18 LAB — FERRITIN: Ferritin: 232 ng/mL (ref 11–307)

## 2019-10-18 MED ORDER — POTASSIUM PHOSPHATES 15 MMOLE/5ML IV SOLN
30.0000 mmol | Freq: Once | INTRAVENOUS | Status: AC
  Administered 2019-10-18: 30 mmol via INTRAVENOUS
  Filled 2019-10-18: qty 10

## 2019-10-18 MED ORDER — INSULIN STARTER KIT- SYRINGES (ENGLISH)
1.0000 | Freq: Once | Status: AC
  Administered 2019-10-18: 1
  Filled 2019-10-18 (×2): qty 1

## 2019-10-18 MED ORDER — HEPARIN BOLUS VIA INFUSION
1200.0000 [IU] | Freq: Once | INTRAVENOUS | Status: AC
  Administered 2019-10-18: 1200 [IU] via INTRAVENOUS
  Filled 2019-10-18: qty 1200

## 2019-10-18 MED ORDER — INSULIN ASPART 100 UNIT/ML ~~LOC~~ SOLN
0.0000 [IU] | Freq: Three times a day (TID) | SUBCUTANEOUS | Status: DC
  Administered 2019-10-18: 2 [IU] via SUBCUTANEOUS
  Administered 2019-10-18: 3 [IU] via SUBCUTANEOUS
  Administered 2019-10-18: 5 [IU] via SUBCUTANEOUS
  Administered 2019-10-19: 7 [IU] via SUBCUTANEOUS
  Administered 2019-10-19 (×2): 3 [IU] via SUBCUTANEOUS
  Administered 2019-10-20: 2 [IU] via SUBCUTANEOUS
  Administered 2019-10-20: 5 [IU] via SUBCUTANEOUS
  Administered 2019-10-20: 2 [IU] via SUBCUTANEOUS
  Filled 2019-10-18 (×8): qty 1

## 2019-10-18 MED ORDER — INSULIN ASPART 100 UNIT/ML ~~LOC~~ SOLN: 0.0000 [IU] | Freq: Every day | SUBCUTANEOUS | Status: DC

## 2019-10-18 MED ORDER — INSULIN ASPART 100 UNIT/ML ~~LOC~~ SOLN
5.0000 [IU] | Freq: Once | SUBCUTANEOUS | Status: AC
  Administered 2019-10-18: 5 [IU] via SUBCUTANEOUS
  Filled 2019-10-18: qty 1

## 2019-10-18 MED ORDER — INSULIN ASPART 100 UNIT/ML ~~LOC~~ SOLN: 1.0000 [IU] | Freq: Three times a day (TID) | SUBCUTANEOUS | Status: DC

## 2019-10-18 NOTE — Consult Note (Signed)
[facility] for Electrolyte Monitoring and Replacement   Recent Labs: Potassium (mmol/L)  Date Value  10/18/2019 3.2 (L)  12/21/2011 3.5   Magnesium (mg/dL)  Date Value  10/18/2019 2.0   Calcium (mg/dL)  Date Value  10/18/2019 7.7 (L)   Calcium, Total (mg/dL)  Date Value  12/21/2011 8.0 (L)   Albumin (g/dL)  Date Value  10/18/2019 2.1 (L)  12/20/2011 4.2   Phosphorus (mg/dL)  Date Value  10/18/2019 1.3 (L)   Sodium  Date Value  10/18/2019 139 mmol/L  12/31/2018 133 (A)  12/21/2011 143 mmol/L   Corrected Ca: 9.2 mg/dL  Assessment: 48 year old female admitted with shortness of breath and emesis with COVID-19 pneumonia and DKA.  She tested positive for COVID-19 5 days prior.  In the ED, she reported vomiting with 20 episodes in a 24 hour period. Both potassium and phosphorous remain low despite 20 mmol IV potassium phosphate 12/27  Goal of Therapy:  All electrolytes wnl  Plan:   Potassium Phosphate 30 mmol IV x 1: this provides 44 mEq IV potassium  recheck electrolytes in the AM  Vallery Sa, PharmD Clinical Pharmacist 10/18/2019

## 2019-10-18 NOTE — Progress Notes (Signed)
ANTICOAGULATION CONSULT NOTE -   Pharmacy Consult for Heparin Indication: elevated D Dimer, Covid + pt, treat as suspected PE  No Known Allergies  Patient Measurements: Height: 5\' 8"  (172.7 cm) Weight: 178 lb (80.7 kg) IBW/kg (Calculated) : 63.9 HEPARIN DW (KG): 80.1  Vital Signs: Temp: 98.8 F (37.1 C) (12/28 0006) Temp Source: Oral (12/28 0006) BP: 143/76 (12/28 0006) Pulse Rate: 108 (12/28 0006)  Labs: Recent Labs    10/15/2019 0239 10/18/2019 0746 09/22/2019 0907 10/17/19 0105 10/17/19 0535 10/17/19 0910 10/17/19 2009 10/18/19 0653  HGB 14.4  --   --   --  12.1  --   --  11.7*  HCT 44.9  --   --   --  35.5*  --   --  33.7*  PLT 471*  --   --   --  432*  --   --  453*  APTT  --  92*  --   --   --   --   --   --   LABPROT  --  14.3  --   --   --   --   --   --   INR  --  1.1  --   --   --   --   --   --   HEPARINUNFRC  --   --    < >  --   --  0.93* 0.23* 0.21*  CREATININE 1.27*  --   --  0.83 0.71  --   --  0.53  TROPONINIHS 20*  --   --   --   --   --   --   --    < > = values in this interval not displayed.    Estimated Creatinine Clearance: 93.8 mL/min (by C-G formula based on SCr of 0.53 mg/dL).  Medical History: Past Medical History:  Diagnosis Date  . Diabetes mellitus without complication (HCC)     Medications:  Medications Prior to Admission  Medication Sig Dispense Refill Last Dose  . empagliflozin (JARDIANCE) 25 MG TABS tablet Take 25 mg by mouth daily.   Past Week at Unknown time  . metFORMIN (GLUCOPHAGE XR) 500 MG 24 hr tablet Take 1 tablet (500 mg total) by mouth daily with breakfast. 90 tablet 1 Past Week at Unknown time  . ondansetron (ZOFRAN ODT) 8 MG disintegrating tablet Take 1 tablet (8 mg total) by mouth every 8 (eight) hours as needed for nausea or vomiting. 15 tablet 0 Past Week at Unknown time  . pregabalin (LYRICA) 150 MG capsule Take 1 capsule (150 mg total) by mouth 2 (two) times daily. 60 capsule 1 Past Week at Unknown time  .  sitaGLIPtin (JANUVIA) 100 MG tablet Take 100 mg by mouth daily.   Past Week at Unknown time  . traMADol (ULTRAM) 50 MG tablet Take 1 tablet (50 mg total) by mouth every 6 (six) hours as needed. (Patient not taking: Reported on 10/08/2019) 12 tablet 0 Completed Course at Unknown time    Assessment: Pharmacy asked to initiate and monitor Heparin for suspected PE, pt is Covid +.  No anticoagulants PTA per current med list.  H&H trending down, PLT stable, no bleeding reported  Heparin Course 12/26 initiation: 5000 unit bolus, then 1300 units/hr 12/26 1258 HL= 0.51 no change 12/26 1854 HL = 0.79 dec rate to 1200 units/hr 12/27 0230 HL = 0.91 dec rate to 1050 units/hr 12/27 0910 HL= 0.93 dec rate to 900 units/hr 12/27 2009  HL = 0.23 inc rate to 1000 units/hr 12/28 0653 HL = 0.21 inc rate to 1150 units/hr  Goal of Therapy:  Heparin level 0.3-0.7 units/ml Monitor platelets by anticoagulation protocol: Yes   Plan:   Heparin level remains subtherapeutic:  bolus with 1200 units x 1 and increase rate to 1150 units/hr  Check heparin level in 6 hours after rate change  Dallie Piles 10/18/2019,8:11 AM

## 2019-10-18 NOTE — Progress Notes (Addendum)
Inpatient Diabetes Program Recommendations  AACE/ADA: New Consensus Statement on Inpatient Glycemic Control (2015)  Target Ranges:  Prepandial:   less than 140 mg/dL      Peak postprandial:   less than 180 mg/dL (1-2 hours)      Critically ill patients:  140 - 180 mg/dL   Results for Jodi Rice, Jodi Rice (MRN [id number]) as of 10/18/2019 10:10  Ref. Range 10/17/2019 04:12 10/17/2019 05:11 10/17/2019 08:38 10/17/2019 11:23 10/17/2019 15:46 10/17/2019 20:07  Glucose-Capillary Latest Ref Range: 70 - 99 mg/dL 167 (H)  IV Insulin Drip OFF at 3am  5 units LEVEMIR given at 3:07am 158 (H) 224 (H)  3 units NOVOLOG  213 (H)  3 units NOVOLOG +  5 units LEVEMIR  151 (H)  1 unit NOVOLOG  177 (H)  2 units NOVOLOG +  5 units LEVEMIR    Results for Jodi Rice, Jodi Rice (MRN [id number]) as of 10/18/2019 10:10  Ref. Range 10/18/2019 00:31 10/18/2019 04:27 10/18/2019 08:10  Glucose-Capillary Latest Ref Range: 70 - 99 mg/dL 264 (H) 196 (H) 156 (H)  2 units NOVOLOG +  5 units LEVEMIR     Home DM Meds: Jardiance 25 mg daily, Metformin XR 500 mg QAM, Januvia 100 mg daily  Current Orders: Levemir 5 units BID      Novolog Sensitive Correction Scale/ SSI (0-9 units) TID AC + HS    Getting Decadron 6 mg Daily    MD- May consider increasing Levemir slightly to 7 units BID  At discharge, may want to consider discontinuing Jardiance due to risk of DKA. Pt may need to be prescribed insulin at discharge for glycemic control.   Has been taking insulin from time to time at home per record review of last ENDO visit on 06/07/2019, even though Novolog was not prescribed to her.  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  Needs follow up soon after discharge with her Endocrinologist.    Addendum 1:15pm--Called pt by phone to discuss her home DM meds, DKA diagnosis, etc.  Discussed with patient diagnosis of DKA (pathophysiology),  treatment of DKA, lab results, and transition plan to SQ insulin regimen.  Pt told me she often forgets to take her oral diabetes meds.  Has taken Novolog at home (her family member's Novolog) when her CBGs are running high (guesses at the dose as she does not have a Rx for Novolog).  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  Stated she would be OK going home on insulin if needed.  Will ask RNs working with pt to review insulin injections with pt in case decision made to send pt home on insulin.    Patient presented to Emergency Room on 10/14/19 with N/V, weakness, COVID (dx 10/11/19). Labs on 10/14/19 @ 12:50 of glucose of 154, CO2 14, AG 23. Patient was discharged from ER on 10/14/19 and presented back on 10/15/19 with worsening symptoms (N/V - more than 20 episodes in 24 hours, shortness of breath, weakness). Labs on 09/21/2019 @2 :39 am with CO2<7, AG not calculated, glucose 389, and Beta-hydroxybutyrate >8.0.    In reviewing chart, noted patient sees [facility] (last seen Renae Gloss, NP on 06/07/19). A1C 10.7% on 06/07/19. Per office visit note on 06/07/19, patient was dx with DM2 over 20 years ago, was only taking Jardiance 25 mg daily and was instructed to continue Jardiance and restart Januvia  100 mg daily and Metformin 500 mg BID.   Also noted that patient has taken Novolog insulin in the past as needed for hyperglycemia (when vision was blurred or she had polyuria).  This medication was never prescribed for her but did belong to a family member who is since deceased.     --Will follow patient during hospitalization--  Wyn Quaker RN, MSN, CDE Diabetes Coordinator Inpatient Glycemic Control Team Team Pager: [phone] (8a-5p)

## 2019-10-18 NOTE — Progress Notes (Signed)
ANTICOAGULATION CONSULT NOTE -   Pharmacy Consult for Heparin Indication: elevated D Dimer, Covid + pt, treat as suspected PE  No Known Allergies  Patient Measurements: Height: 5\' 8"  (172.7 cm) Weight: 178 lb (80.7 kg) IBW/kg (Calculated) : 63.9 HEPARIN DW (KG): 80.1  Vital Signs: Temp: 99.3 F (37.4 C) (12/28 2120) Temp Source: Oral (12/28 2120) BP: 169/92 (12/28 2120) Pulse Rate: 122 (12/28 2120)  Labs: Recent Labs    10/01/2019 0239 09/29/2019 0746 10/01/2019 0907 10/17/19 0105 10/17/19 0535 10/17/19 2009 10/18/19 0653 10/18/19 1655  HGB 14.4  --   --   --  12.1  --  11.7*  --   HCT 44.9  --   --   --  35.5*  --  33.7*  --   PLT 471*  --   --   --  432*  --  453*  --   APTT  --  92*  --   --   --   --   --   --   LABPROT  --  14.3  --   --   --   --   --   --   INR  --  1.1  --   --   --   --   --   --   HEPARINUNFRC  --   --    < >  --   --  0.23* 0.21* 0.33  CREATININE 1.27*  --   --  0.83 0.71  --  0.53  --   TROPONINIHS 20*  --   --   --   --   --   --   --    < > = values in this interval not displayed.    Estimated Creatinine Clearance: 93.8 mL/min (by C-G formula based on SCr of 0.53 mg/dL).  Medical History: Past Medical History:  Diagnosis Date  . Diabetes mellitus without complication (HCC)     Medications:  Medications Prior to Admission  Medication Sig Dispense Refill Last Dose  . empagliflozin (JARDIANCE) 25 MG TABS tablet Take 25 mg by mouth daily.   Past Week at Unknown time  . metFORMIN (GLUCOPHAGE XR) 500 MG 24 hr tablet Take 1 tablet (500 mg total) by mouth daily with breakfast. 90 tablet 1 Past Week at Unknown time  . ondansetron (ZOFRAN ODT) 8 MG disintegrating tablet Take 1 tablet (8 mg total) by mouth every 8 (eight) hours as needed for nausea or vomiting. 15 tablet 0 Past Week at Unknown time  . pregabalin (LYRICA) 150 MG capsule Take 1 capsule (150 mg total) by mouth 2 (two) times daily. 60 capsule 1 Past Week at Unknown time  .  sitaGLIPtin (JANUVIA) 100 MG tablet Take 100 mg by mouth daily.   Past Week at Unknown time  . traMADol (ULTRAM) 50 MG tablet Take 1 tablet (50 mg total) by mouth every 6 (six) hours as needed. (Patient not taking: Reported on 10/15/2019) 12 tablet 0 Completed Course at Unknown time    Assessment: Pharmacy asked to initiate and monitor Heparin for suspected PE, pt is Covid +.  No anticoagulants PTA per current med list.  H&H trending down, PLT stable, no bleeding reported  Heparin Course 12/26 initiation: 5000 unit bolus, then 1300 units/hr 12/26 1258 HL= 0.51 no change 12/26 1854 HL = 0.79 dec rate to 1200 units/hr 12/27 0230 HL = 0.91 dec rate to 1050 units/hr 12/27 0910 HL= 0.93 dec rate to 900 units/hr 12/27 2009  HL = 0.23 inc rate to 1000 units/hr 12/28 0653 HL = 0.21 inc rate to 1150 units/hr 12/28 1655 HL 0.33, therapeutic x1, continue current rate  Goal of Therapy:  Heparin level 0.3-0.7 units/ml Monitor platelets by anticoagulation protocol: Yes   Plan:   Continue current rate  Check heparin level in 6 hours to confirm  CBC in AM  Rayna Sexton L 10/18/2019,9:33 PM

## 2019-10-18 NOTE — Progress Notes (Signed)
PROGRESS NOTE    Jodi Rice  [id number] DOB: [birth year] DOA: 10/10/2019 PCP: Elby Beck, FNP    Assessment & Plan:   Active Problems:   COVID-19    Jodi Rice is a 47 y.o. AA female with medical history significant for Type II Diabetes Mellitus, Peripheral Neuropathy, HTN, Hyperlipidemia, and Vitamin D Deficiency.  She presented to [facility] ER on 12/26 with c/o nausea and shortness of breath.  She was diagnosed with COVID-19 in the outpatient setting on 10/11/2019.  She previously presented to [facility] ER on 10/14/19 with c/o worsening weakness and nausea/vomiting.    During current ER presentation lab results revealed Na+ 146, CO2 <7, glucose 389, BUN 27, creatinine 1.27, total bilirubin 2.4, LDH 306, troponin I 20, ferritin 331, lactic acid 4.3, wbc 22.7, fibrinogen >750, d-dimer >7,500, and abg ph 7.08/pCO2 <19/pO2 80/acid-base deficit 24/bicarb 3.9.  Pt ruled in for DKA, therefore insulin gtt initiated.  CXR concerning for viral pneumonia.  Pt received ceftriaxone and azithromycin.  She was subsequently admitted to ICU for additional workup and treatment.     Acute respiratory failure secondary to COVID-19pneumonia --Prn supplemental O2 for dyspnea and/or hypoxia  --Continue decadron and remdesivir  --vitamin C, mvi, thiamine, zinc, and folic acid  --Prn bronchodilator therapy  --Trend inflammatory markers  --Pulmonary hygiene  --d/c azithromycin and ceftriaxone since procal neg --continue heparin gtt for DVT ppx, managed by pharmacy  Acute renal failure secondary to hypovolemia in setting of DKA  On presentation, Cr 1.27 (baseline 0.6).  Resolved with IVF hydration.  Lactic acidosis Lactic acid 4.3 on presentation, normalized with IVF hydration.  Diabetic ketoacidosis, resolved Hx: peripheral neuropathy  S/p DKA insulin gtt protocal, with gap closed. --Diabetes coordinator consulted appreciate input  --Continue outpatient lyrica  --Levemir 5u  BID --SSI TID --continue LR@75  for now   DVT prophylaxis: XB:[id number] gtt Code Status: Full code  Family Communication: none today Disposition Plan: Home   Subjective and Interval History:  Pt reported DOE, but feeling better overall.  Nausea and abdominal pain improved.  Started eating/drinking a little bit.  No fever, chest pain, diarrhea, dysuria, increased swelling.   Objective: Vitals:   10/17/19 2100 10/17/19 2200 10/18/19 0006 10/18/19 1154  BP: (!) 162/96 (!) 163/93 (!) 143/76 117/69  Pulse: (!) 112 (!) 104 (!) 108 (!) 102  Resp: (!) 23 (!) 32 20 (!) 24  Temp:   98.8 F (37.1 C) 99.8 F (37.7 C)  TempSrc:   Oral Oral  SpO2: 93% 96% 90% 91%  Weight:      Height:        Intake/Output Summary (Last 24 hours) at 10/18/2019 1632 Last data filed at 10/17/2019 1800 Gross per 24 hour  Intake 27 ml  Output --  Net 27 ml   Filed Weights   10/15/19 2055  Weight: 80.7 kg    Examination:   Constitutional: NAD, AAOx3 HEENT: conjunctivae and lids normal, EOMI CV: RRR no M,R,G. Distal pulses +2.  No cyanosis.   RESP: CTA B/L, normal respiratory effort  GI: +BS, NTND Extremities: No effusions, edema, or tenderness in BLE SKIN: warm, dry and intact Neuro: II - XII grossly intact.  Sensation intact Psych: Normal mood and affect.  Appropriate judgement and reason   Data Reviewed: I have personally reviewed following labs and imaging studies  CBC: Recent Labs  Lab 10/14/19 1250 10/17/2019 0239 10/17/19 0535 10/18/19 0653  WBC 9.3 22.7* 23.1* 16.7*  NEUTROABS  --  19.6* 20.5* 14.4*  HGB 13.9 14.4 12.1 11.7*  HCT 42.0 44.9 35.5* 33.7*  MCV 88.1 89.1 84.5 83.4  PLT 358 471* 432* 979*   Basic Metabolic Panel: Recent Labs  Lab 10/06/2019 1649 10/02/2019 2043 10/17/19 0105 10/17/19 0535 10/18/19 0653  NA 145 145 144 143 139  K 4.0 4.1 3.6 3.8 3.2*  CL 114* 113* 114* 113* 110  CO2 20* 19* 20* 20* 22  GLUCOSE 166* 157* 160* 172* 176*  BUN 30* 28* 26* 25* 16   CREATININE 0.93 0.79 0.83 0.71 0.53  CALCIUM 7.8* 8.1* 8.0* 7.9* 7.7*  MG  --   --   --  2.3 2.0  PHOS  --   --   --  1.6* 1.3*   GFR: Estimated Creatinine Clearance: 93.8 mL/min (by C-G formula based on SCr of 0.53 mg/dL). Liver Function Tests: Recent Labs  Lab 10/14/19 1250 09/22/2019 0239 10/17/19 0535 10/18/19 0653  AST 23 31 21 17   ALT 14 16 10 11   ALKPHOS 74 94 76 75  BILITOT 2.0* 2.4* 0.8 0.5  PROT 7.3 7.6 6.0* 5.6*  ALBUMIN 3.0* 3.0* 2.2* 2.1*   Recent Labs  Lab 10/14/19 1250  LIPASE 14   No results for input(s): AMMONIA in the last 168 hours. Coagulation Profile: Recent Labs  Lab 10/20/2019 0746  INR 1.1   Cardiac Enzymes: No results for input(s): CKTOTAL, CKMB, CKMBINDEX, TROPONINI in the last 168 hours. BNP (last 3 results) No results for input(s): PROBNP in the last 8760 hours. HbA1C: No results for input(s): HGBA1C in the last 72 hours. CBG: Recent Labs  Lab 10/17/19 2007 10/18/19 0031 10/18/19 0427 10/18/19 0810 10/18/19 1149  GLUCAP 177* 264* 196* 156* 210*   Lipid Profile: Recent Labs    10/12/2019 0239  TRIG 236*   Thyroid Function Tests: No results for input(s): TSH, T4TOTAL, FREET4, T3FREE, THYROIDAB in the last 72 hours. Anemia Panel: Recent Labs    10/17/19 0535 10/18/19 0653  FERRITIN 315* 232   Sepsis Labs: Recent Labs  Lab 09/22/2019 0239 09/25/2019 0907 10/15/2019 1257  PROCALCITON 0.23  --   --   LATICACIDVEN 4.3* 2.4* 1.1    Recent Results (from the past 240 hour(s))  Novel Coronavirus, NAA ([facility])     Status: Abnormal   Collection Time: 10/11/19  1:37 PM   Specimen: Nasopharyngeal(NP) swabs in vial transport medium   NASOPHARYNGE  TESTING  Result Value Ref Range Status   SARS-CoV-2, NAA Detected (A) Not Detected Final    Comment: This nucleic acid amplification test was developed and its performance characteristics determined by [employer]. Nucleic acid amplification tests include PCR and TMA. This test  has not been FDA cleared or approved. This test has been authorized by FDA under an Emergency Use Authorization (EUA). This test is only authorized for the duration of time the declaration that circumstances exist justifying the authorization of the emergency use of in vitro diagnostic tests for detection of SARS-CoV-2 virus and/or diagnosis of COVID-19 infection under section 564(b)(1) of the Act, 21 U.S.C. 480XKP-5(V) (1), unless the authorization is terminated or revoked sooner. When diagnostic testing is negative, the possibility of a false negative result should be considered in the context of a patient's recent exposures and the presence of clinical signs and symptoms consistent with COVID-19. An individual without symptoms of COVID-19 and who is not shedding SARS-CoV-2 virus would  expect to have a negative (not detected) result in this assay.   Blood Culture (routine x 2)  Status: None (Preliminary result)   Collection Time: 10/05/2019  1:31 AM   Specimen: BLOOD  Result Value Ref Range Status   Specimen Description BLOOD LEFT ANTECUBITAL  Final   Special Requests   Final    BOTTLES DRAWN AEROBIC AND ANAEROBIC Blood Culture results may not be optimal due to an inadequate volume of blood received in culture bottles   Culture   Final    NO GROWTH 2 DAYS Performed at [facility], [address], [place], [place] [address]    Report Status PENDING  Incomplete  Blood Culture (routine x 2)     Status: None (Preliminary result)   Collection Time: 10/12/2019  2:39 AM   Specimen: BLOOD  Result Value Ref Range Status   Specimen Description BLOOD RIGHT HAND  Final   Special Requests   Final    BOTTLES DRAWN AEROBIC ONLY Blood Culture results may not be optimal due to an inadequate volume of blood received in culture bottles   Culture   Final    NO GROWTH 2 DAYS Performed at [facility], [address], [place], [place] [address]    Report Status PENDING   Incomplete  MRSA PCR Screening     Status: None   Collection Time: 10/03/2019  6:37 AM   Specimen: Nasopharyngeal  Result Value Ref Range Status   MRSA by PCR NEGATIVE NEGATIVE Final    Comment:        The GeneXpert MRSA Assay (FDA approved for NASAL specimens only), is one component of a comprehensive MRSA colonization surveillance program. It is not intended to diagnose MRSA infection nor to guide or monitor treatment for MRSA infections. Performed at [facility], [address], [place], [place] [address]       Radiology Studies: No results found.   Scheduled Meds: . vitamin C  500 mg Oral Daily  . aspirin EC  81 mg Oral Daily  . Chlorhexidine Gluconate Cloth  6 each Topical Daily  . dexamethasone (DECADRON) injection  6 mg Intravenous Q24H  . folic acid  1 mg Oral Daily  . insulin aspart  0-5 Units Subcutaneous QHS  . insulin aspart  0-9 Units Subcutaneous TID WC  . insulin detemir  5 Units Subcutaneous Q12H  . insulin starter kit- syringes  1 kit Other Once  . mouth rinse  15 mL Mouth Rinse BID  . multivitamin with minerals  1 tablet Oral Daily  . pregabalin  150 mg Oral BID  . thiamine  100 mg Oral Daily  . zinc sulfate  220 mg Oral Daily   Continuous Infusions: . famotidine (PEPCID) IV 20 mg (10/18/19 0917)  . heparin 1,150 Units/hr (10/18/19 0904)  . lactated ringers 75 mL/hr at 10/18/19 0807  . potassium PHOSPHATE IVPB (in mmol) 30 mmol (10/18/19 1258)  . remdesivir 100 mg in NS 100 mL 100 mg (10/18/19 1022)     LOS: 2 days     Enzo Bi, MD [facility] If 7PM-7AM, please contact night-coverage 10/18/2019, 4:32 PM

## 2019-10-19 ENCOUNTER — Inpatient Hospital Stay: Payer: 59

## 2019-10-19 ENCOUNTER — Encounter: Payer: Self-pay | Admitting: Internal Medicine

## 2019-10-19 ENCOUNTER — Other Ambulatory Visit: Payer: Self-pay

## 2019-10-19 DIAGNOSIS — R0902 Hypoxemia: Secondary | ICD-10-CM

## 2019-10-19 LAB — COMPREHENSIVE METABOLIC PANEL
ALT: 9 U/L (ref 0–44)
AST: 13 U/L — ABNORMAL LOW (ref 15–41)
Albumin: 2.1 g/dL — ABNORMAL LOW (ref 3.5–5.0)
Alkaline Phosphatase: 79 U/L (ref 38–126)
Anion gap: 11 (ref 5–15)
BUN: 9 mg/dL (ref 6–20)
CO2: 23 mmol/L (ref 22–32)
Calcium: 7.7 mg/dL — ABNORMAL LOW (ref 8.9–10.3)
Chloride: 106 mmol/L (ref 98–111)
Creatinine, Ser: 0.51 mg/dL (ref 0.44–1.00)
GFR calc Af Amer: >60 mL/min (ref >60)
GFR calc non Af Amer: >60 mL/min (ref >60)
Glucose, Bld: 211 mg/dL — ABNORMAL HIGH (ref 70–99)
Potassium: 3.4 mmol/L — ABNORMAL LOW (ref 3.5–5.1)
Sodium: 140 mmol/L (ref 135–145)
Total Bilirubin: 0.7 mg/dL (ref 0.3–1.2)
Total Protein: 6.1 g/dL — ABNORMAL LOW (ref 6.5–8.1)

## 2019-10-19 LAB — FERRITIN: Ferritin: 190 ng/mL (ref 11–307)

## 2019-10-19 LAB — GLUCOSE, CAPILLARY
Glucose-Capillary: 215 mg/dL — ABNORMAL HIGH (ref 70–99)
Glucose-Capillary: 312 mg/dL — ABNORMAL HIGH (ref 70–99)
Glucose-Capillary: 214 mg/dL — ABNORMAL HIGH (ref 70–99)
Glucose-Capillary: 260 mg/dL — ABNORMAL HIGH (ref 70–99)

## 2019-10-19 LAB — PHOSPHORUS: Phosphorus: 2.9 mg/dL (ref 2.5–4.6)

## 2019-10-19 LAB — FIBRIN DERIVATIVES D-DIMER (ARMC ONLY): Fibrin derivatives D-dimer (ARMC): 1665.77 ng/mL (FEU) — ABNORMAL HIGH (ref 0.00–499.00)

## 2019-10-19 LAB — C-REACTIVE PROTEIN: CRP: 23.5 mg/dL — ABNORMAL HIGH (ref <1.0)

## 2019-10-19 LAB — CBC WITH DIFFERENTIAL/PLATELET
Abs Immature Granulocytes: 0.26 10*3/uL — ABNORMAL HIGH (ref 0.00–0.07)
Basophils Absolute: 0 10*3/uL (ref 0.0–0.1)
Basophils Relative: 0 %
Eosinophils Absolute: 0 10*3/uL (ref 0.0–0.5)
Eosinophils Relative: 0 %
HCT: 34.1 % — ABNORMAL LOW (ref 36.0–46.0)
Hemoglobin: 11.7 g/dL — ABNORMAL LOW (ref 12.0–15.0)
Immature Granulocytes: 2 %
Lymphocytes Relative: 7 %
Lymphs Abs: 1 10*3/uL (ref 0.7–4.0)
MCH: 28.9 pg (ref 26.0–34.0)
MCHC: 34.3 g/dL (ref 30.0–36.0)
MCV: 84.2 fL (ref 80.0–100.0)
Monocytes Absolute: 0.7 10*3/uL (ref 0.1–1.0)
Monocytes Relative: 5 %
Neutro Abs: 13.1 10*3/uL — ABNORMAL HIGH (ref 1.7–7.7)
Neutrophils Relative %: 86 %
Platelets: 482 10*3/uL — ABNORMAL HIGH (ref 150–400)
RBC: 4.05 MIL/uL (ref 3.87–5.11)
RDW: 13.8 % (ref 11.5–15.5)
WBC: 15.1 10*3/uL — ABNORMAL HIGH (ref 4.0–10.5)
nRBC: 0 % (ref 0.0–0.2)

## 2019-10-19 LAB — HEPARIN LEVEL (UNFRACTIONATED)
Heparin Unfractionated: 0.33 IU/mL (ref 0.30–0.70)
Heparin Unfractionated: 0.18 IU/mL — ABNORMAL LOW (ref 0.30–0.70)

## 2019-10-19 LAB — MAGNESIUM: Magnesium: 2.1 mg/dL (ref 1.7–2.4)

## 2019-10-19 MED ORDER — ONDANSETRON 4 MG PO TBDP
4.0000 mg | ORAL_TABLET | Freq: Three times a day (TID) | ORAL | Status: DC | PRN
  Filled 2019-10-19: qty 1

## 2019-10-19 MED ORDER — DOCUSATE SODIUM 100 MG PO CAPS: 100.0000 mg | ORAL_CAPSULE | Freq: Two times a day (BID) | ORAL | Status: DC | PRN

## 2019-10-19 MED ORDER — CALCIUM CARBONATE ANTACID 500 MG PO CHEW: 1.0000 | CHEWABLE_TABLET | Freq: Three times a day (TID) | ORAL | Status: DC | PRN

## 2019-10-19 MED ORDER — ENOXAPARIN SODIUM 40 MG/0.4ML ~~LOC~~ SOLN
40.0000 mg | Freq: Two times a day (BID) | SUBCUTANEOUS | Status: DC
  Administered 2019-10-19 – 2019-10-20 (×4): 40 mg via SUBCUTANEOUS
  Filled 2019-10-19 (×4): qty 0.4

## 2019-10-19 MED ORDER — ACETAMINOPHEN 500 MG PO TABS
1000.0000 mg | ORAL_TABLET | Freq: Three times a day (TID) | ORAL | Status: DC | PRN
  Administered 2019-10-19 – 2019-10-20 (×2): 1000 mg via ORAL
  Filled 2019-10-19 (×2): qty 2

## 2019-10-19 MED ORDER — FUROSEMIDE 10 MG/ML IJ SOLN
40.0000 mg | Freq: Once | INTRAMUSCULAR | Status: AC
  Administered 2019-10-19: 40 mg via INTRAVENOUS
  Filled 2019-10-19: qty 4

## 2019-10-19 MED ORDER — POLYETHYLENE GLYCOL 3350 17 G PO PACK: 17.0000 g | PACK | Freq: Two times a day (BID) | ORAL | Status: DC | PRN

## 2019-10-19 MED ORDER — ALUM & MAG HYDROXIDE-SIMETH 200-200-20 MG/5ML PO SUSP: 15.0000 mL | Freq: Four times a day (QID) | ORAL | Status: DC | PRN

## 2019-10-19 MED ORDER — BLISTEX MEDICATED EX OINT
1.0000 "application " | TOPICAL_OINTMENT | CUTANEOUS | Status: DC | PRN
  Filled 2019-10-19: qty 6.3

## 2019-10-19 MED ORDER — IOHEXOL 350 MG/ML SOLN
75.0000 mL | Freq: Once | INTRAVENOUS | Status: AC | PRN
  Administered 2019-10-19: 75 mL via INTRAVENOUS

## 2019-10-19 MED ORDER — ONDANSETRON HCL 4 MG/2ML IJ SOLN
4.0000 mg | Freq: Four times a day (QID) | INTRAMUSCULAR | Status: DC | PRN
  Administered 2019-10-19 – 2019-10-20 (×2): 4 mg via INTRAVENOUS
  Filled 2019-10-19 (×2): qty 2

## 2019-10-19 MED ORDER — HEPARIN BOLUS VIA INFUSION
2400.0000 [IU] | Freq: Once | INTRAVENOUS | Status: AC
  Administered 2019-10-19: 2400 [IU] via INTRAVENOUS
  Filled 2019-10-19: qty 2400

## 2019-10-19 MED ORDER — POTASSIUM CHLORIDE CRYS ER 20 MEQ PO TBCR
40.0000 meq | EXTENDED_RELEASE_TABLET | Freq: Once | ORAL | Status: AC
  Administered 2019-10-19: 15:00:00 40 meq via ORAL
  Filled 2019-10-19: qty 2

## 2019-10-19 NOTE — Progress Notes (Signed)
Inpatient Diabetes Program Recommendations  AACE/ADA: New Consensus Statement on Inpatient Glycemic Control (2015)  Target Ranges:  Prepandial:   less than 140 mg/dL      Peak postprandial:   less than 180 mg/dL (1-2 hours)      Critically ill patients:  140 - 180 mg/dL   Results for Jodi Rice, Jodi Rice (MRN [id number]) as of 10/19/2019 08:20  Ref. Range 10/18/2019 08:10 10/18/2019 11:49 10/18/2019 17:04 10/18/2019 21:16  Glucose-Capillary Latest Ref Range: 70 - 99 mg/dL 156 (H)  2 units NOVOLOG +  5 units LEVEMIR  210 (H)  3 units NOVOLOG  259 (H)  5 units NOVOLOG  257 (H)     5 units LEVEMIR   Results for Jodi Rice, Jodi Rice (MRN [id number]) as of 10/19/2019 08:20  Ref. Range 10/19/2019 07:47  Glucose-Capillary Latest Ref Range: 70 - 99 mg/dL 215 (H)    Home DM Meds: Jardiance 25 mg daily, Metformin XR 500 mg QAM, Januvia 100 mg daily  Current Orders: Levemir 5 units BID                            Novolog Sensitive Correction Scale/ SSI (0-9 units) TID AC    Getting Decadron 6 mg Daily    MD- Please consider increasing Levemir slightly to 7 units BID  Please also consider adding Novolog Meal Coverage: Novolog 3 units TID with meals    At discharge, may want to consider discontinuing Jardiance due to risk of DKA. Pt may need to be prescribed insulin at dischargefor glycemic control.   Has been taking insulin from time to time at home per record review of last ENDO visit on 06/07/2019, even though Novolog was not prescribed to her.  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  Needs follow up soon after discharge with her Endocrinologist.     In reviewing chart, noted patient sees [facility] (last seen Renae Gloss, NP on 06/07/19). A1C 10.7% on 06/07/19. Per office visit note on 06/07/19, patient was dx with DM2 over 20 years ago, wasonly taking Jardiance 25 mg dailyand  wasinstructed to continue Jardiance and restart Januvia 100 mg daily and Metformin 500 mg BID.  Also noted that patienthas taken Novolog insulin in the pastas needed for hyperglycemia (when vision was blurred or she had polyuria).  This medication was never prescribed for her but did belong to a family member who is since deceased.    --Will follow patient during hospitalization--  Wyn Quaker RN, MSN, CDE Diabetes Coordinator Inpatient Glycemic Control Team Team Pager: [phone] (8a-5p)

## 2019-10-19 NOTE — Progress Notes (Addendum)
Patient O2 sats currently in the 80's. Pt is on 6L [place]. Called Respiratory to assess patient.

## 2019-10-19 NOTE — Progress Notes (Signed)
ANTICOAGULATION CONSULT NOTE -   Pharmacy Consult for Heparin Indication: elevated D Dimer, Covid + pt, treat as suspected PE  No Known Allergies  Patient Measurements: Height: 5\' 8"  (172.7 cm) Weight: 178 lb (80.7 kg) IBW/kg (Calculated) : 63.9 HEPARIN DW (KG): 80.1  Vital Signs: Temp: 99.7 F (37.6 C) (12/28 2320) Temp Source: Oral (12/28 2320) BP: 135/77 (12/28 2219) Pulse Rate: 107 (12/28 2219)  Labs: Recent Labs    10/13/2019 0239 10/01/2019 0746 09/22/2019 0907 10/17/19 0105 10/17/19 0535 10/18/19 0653 10/18/19 1655 10/18/19 2305  HGB 14.4  --   --   --  12.1 11.7*  --   --   HCT 44.9  --   --   --  35.5* 33.7*  --   --   PLT 471*  --   --   --  432* 453*  --   --   APTT  --  92*  --   --   --   --   --   --   LABPROT  --  14.3  --   --   --   --   --   --   INR  --  1.1  --   --   --   --   --   --   HEPARINUNFRC  --   --    < >  --   --  0.21* 0.33 0.33  CREATININE 1.27*  --   --  0.83 0.71 0.53  --   --   TROPONINIHS 20*  --   --   --   --   --   --   --    < > = values in this interval not displayed.    Estimated Creatinine Clearance: 93.8 mL/min (by C-G formula based on SCr of 0.53 mg/dL).  Medical History: Past Medical History:  Diagnosis Date  . Diabetes mellitus without complication (HCC)     Medications:  Medications Prior to Admission  Medication Sig Dispense Refill Last Dose  . empagliflozin (JARDIANCE) 25 MG TABS tablet Take 25 mg by mouth daily.   Past Week at Unknown time  . metFORMIN (GLUCOPHAGE XR) 500 MG 24 hr tablet Take 1 tablet (500 mg total) by mouth daily with breakfast. 90 tablet 1 Past Week at Unknown time  . ondansetron (ZOFRAN ODT) 8 MG disintegrating tablet Take 1 tablet (8 mg total) by mouth every 8 (eight) hours as needed for nausea or vomiting. 15 tablet 0 Past Week at Unknown time  . pregabalin (LYRICA) 150 MG capsule Take 1 capsule (150 mg total) by mouth 2 (two) times daily. 60 capsule 1 Past Week at Unknown time  .  sitaGLIPtin (JANUVIA) 100 MG tablet Take 100 mg by mouth daily.   Past Week at Unknown time  . traMADol (ULTRAM) 50 MG tablet Take 1 tablet (50 mg total) by mouth every 6 (six) hours as needed. (Patient not taking: Reported on 09/22/2019) 12 tablet 0 Completed Course at Unknown time    Assessment: Pharmacy asked to initiate and monitor Heparin for suspected PE, pt is Covid +.  No anticoagulants PTA per current med list.  H&H trending down, PLT stable, no bleeding reported  Heparin Course 12/26 initiation: 5000 unit bolus, then 1300 units/hr 12/26 1258 HL= 0.51 no change 12/26 1854 HL = 0.79 dec rate to 1200 units/hr 12/27 0230 HL = 0.91 dec rate to 1050 units/hr 12/27 0910 HL= 0.93 dec rate to 900 units/hr 12/27 2009  HL = 0.23 inc rate to 1000 units/hr 12/28 0653 HL = 0.21 inc rate to 1150 units/hr 12/28 1655 HL 0.33, therapeutic x1, continue current rate  Goal of Therapy:  Heparin level 0.3-0.7 units/ml Monitor platelets by anticoagulation protocol: Yes   Plan:  12/28 @ 1630 HL 0.33 therapeutic. Will continue current rate and recheck w/ am labs.  Tobie Lords, PharmD, BCPS Clinical Pharmacist 10/19/2019,12:52 AM

## 2019-10-19 NOTE — Progress Notes (Signed)
PROGRESS NOTE    Jodi Rice  [id number] DOB: [birth year] DOA: 10/12/2019 PCP: Elby Beck, FNP    Assessment & Plan:   Active Problems:   COVID-19    Jodi Rice is a 48 y.o. AA female with medical history significant for Type II Diabetes Mellitus, Peripheral Neuropathy, HTN, Hyperlipidemia, and Vitamin D Deficiency.  She presented to [facility] ER on 12/26 with c/o nausea and shortness of breath.  She was diagnosed with COVID-19 in the outpatient setting on 10/11/2019.  She previously presented to [facility] ER on 10/14/19 with c/o worsening weakness and nausea/vomiting.    During current ER presentation lab results revealed Na+ 146, CO2 <7, glucose 389, BUN 27, creatinine 1.27, total bilirubin 2.4, LDH 306, troponin I 20, ferritin 331, lactic acid 4.3, wbc 22.7, fibrinogen >750, d-dimer >7,500, and abg ph 7.08/pCO2 <19/pO2 80/acid-base deficit 24/bicarb 3.9.  Pt ruled in for DKA, therefore insulin gtt initiated.  CXR concerning for viral pneumonia.  Pt received ceftriaxone and azithromycin.  She was subsequently admitted to ICU for additional workup and treatment.     Acute respiratory failure secondary to COVID-19pneumonia --Pt had no O2 requirement on presentation, however, needed 2L after transferring out of ICU and today up to 10L.  Maybe be fluid overload since pt had been on MIVF since DKA, but also need to consider PE since D-dimer very elevated on presentation. --azithromycin and ceftriaxone d/c'ed since procal neg --Heparin gtt for DVT ppx d/c'ed PLAN: --Continue decadron and remdesivir  --vitamin C, mvi, thiamine, zinc, and folic acid  --Prn bronchodilator therapy  --Trend inflammatory markers  --Pulmonary hygiene  --IV lasix 40 mg x1 --CT chest PE study  Acute renal failure secondary to hypovolemia in setting of DKA, resolved  On presentation, Cr 1.27 (baseline 0.6).  Resolved with IVF hydration.  Lactic acidosis, resolved Lactic acid 4.3 on  presentation, normalized with IVF hydration.  Diabetic ketoacidosis, resolved Hx: peripheral neuropathy  S/p DKA insulin gtt protocal, with gap closed. --Diabetes coordinator consulted appreciate input  --Continue outpatient lyrica  --Levemir 5u BID --SSI TID --d/c MIVF today   DVT prophylaxis: Lovenox SQ Code Status: Full code  Family Communication: none today Disposition Plan: Home   Subjective and Interval History:  Pt complained of more cough and dyspnea on exertion, though able to get up and walk.  Having some nausea and diarrhea.  Eating small amount at a time.  No fever, abdominal pain, dysuria, increased swelling.  Pt's O2 requirement acutely went up to 10L (from 2L) later in afternoon.  Ordered IV lasix 40 x1 and CT chest PE study.   Objective: Vitals:   10/19/19 0835 10/19/19 1204 10/19/19 1440 10/19/19 1444  BP:  (!) 166/93    Pulse:  (!) 113    Resp:  20    Temp:  99.2 F (37.3 C)    TempSrc:  Oral    SpO2: 92% 94% (!) 87% 92%  Weight:      Height:        Intake/Output Summary (Last 24 hours) at 10/19/2019 1822 Last data filed at 10/19/2019 1111 Gross per 24 hour  Intake 875.72 ml  Output 1000 ml  Net -124.28 ml   Filed Weights   10/15/19 2055  Weight: 80.7 kg    Examination:   Constitutional: NAD, AAOx3 HEENT: conjunctivae and lids normal, EOMI CV: RRR no M,R,G. Distal pulses +2.  No cyanosis.   RESP: crackles at posterior bases, normal respiratory effort, on 2L GI: +BS, NTND  Extremities: No effusions, edema, or tenderness in BLE SKIN: warm, dry and intact Neuro: II - XII grossly intact.  Sensation intact Psych: Normal mood and affect.  Appropriate judgement and reason   Data Reviewed: I have personally reviewed following labs and imaging studies  CBC: Recent Labs  Lab 10/14/19 1250 10/15/2019 0239 10/17/19 0535 10/18/19 0653 10/19/19 0505  WBC 9.3 22.7* 23.1* 16.7* 15.1*  NEUTROABS  --  19.6* 20.5* 14.4* 13.1*  HGB 13.9 14.4 12.1  11.7* 11.7*  HCT 42.0 44.9 35.5* 33.7* 34.1*  MCV 88.1 89.1 84.5 83.4 84.2  PLT 358 471* 432* 453* [id number]*   Basic Metabolic Panel: Recent Labs  Lab 09/22/2019 2043 10/17/19 0105 10/17/19 0535 10/18/19 0653 10/19/19 0505  NA 145 144 143 139 140  K 4.1 3.6 3.8 3.2* 3.4*  CL 113* 114* 113* 110 106  CO2 19* 20* 20* 22 23  GLUCOSE 157* 160* 172* 176* 211*  BUN 28* 26* 25* 16 9  CREATININE 0.79 0.83 0.71 0.53 0.51  CALCIUM 8.1* 8.0* 7.9* 7.7* 7.7*  MG  --   --  2.3 2.0 2.1  PHOS  --   --  1.6* 1.3* 2.9   GFR: Estimated Creatinine Clearance: 93.8 mL/min (by C-G formula based on SCr of 0.51 mg/dL). Liver Function Tests: Recent Labs  Lab 10/14/19 1250 09/30/2019 0239 10/17/19 0535 10/18/19 0653 10/19/19 0505  AST 23 31 21 17  13*  ALT 14 16 10 11 9   ALKPHOS 74 94 76 75 79  BILITOT 2.0* 2.4* 0.8 0.5 0.7  PROT 7.3 7.6 6.0* 5.6* 6.1*  ALBUMIN 3.0* 3.0* 2.2* 2.1* 2.1*   Recent Labs  Lab 10/14/19 1250  LIPASE 14   No results for input(s): AMMONIA in the last 168 hours. Coagulation Profile: Recent Labs  Lab 10/06/2019 0746  INR 1.1   Cardiac Enzymes: No results for input(s): CKTOTAL, CKMB, CKMBINDEX, TROPONINI in the last 168 hours. BNP (last 3 results) No results for input(s): PROBNP in the last 8760 hours. HbA1C: No results for input(s): HGBA1C in the last 72 hours. CBG: Recent Labs  Lab 10/18/19 1704 10/18/19 2116 10/19/19 0747 10/19/19 1202 10/19/19 1652  GLUCAP 259* 257* 215* 312* 214*   Lipid Profile: No results for input(s): CHOL, HDL, LDLCALC, TRIG, CHOLHDL, LDLDIRECT in the last 72 hours. Thyroid Function Tests: No results for input(s): TSH, T4TOTAL, FREET4, T3FREE, THYROIDAB in the last 72 hours. Anemia Panel: Recent Labs    10/18/19 0653 10/19/19 0505  FERRITIN 232 190   Sepsis Labs: Recent Labs  Lab 10/20/2019 0239 10/19/2019 0907 10/01/2019 1257  PROCALCITON 0.23  --   --   LATICACIDVEN 4.3* 2.4* 1.1    Recent Results (from the past 240  hour(s))  Novel Coronavirus, NAA ([facility])     Status: Abnormal   Collection Time: 10/11/19  1:37 PM   Specimen: Nasopharyngeal(NP) swabs in vial transport medium   NASOPHARYNGE  TESTING  Result Value Ref Range Status   SARS-CoV-2, NAA Detected (A) Not Detected Final    Comment: This nucleic acid amplification test was developed and its performance characteristics determined by [employer]. Nucleic acid amplification tests include PCR and TMA. This test has not been FDA cleared or approved. This test has been authorized by FDA under an Emergency Use Authorization (EUA). This test is only authorized for the duration of time the declaration that circumstances exist justifying the authorization of the emergency use of in vitro diagnostic tests for detection of SARS-CoV-2 virus and/or diagnosis of COVID-19  infection under section 564(b)(1) of the Act, 21 U.S.C. [id number]) (1), unless the authorization is terminated or revoked sooner. When diagnostic testing is negative, the possibility of a false negative result should be considered in the context of a patient's recent exposures and the presence of clinical signs and symptoms consistent with COVID-19. An individual without symptoms of COVID-19 and who is not shedding SARS-CoV-2 virus would  expect to have a negative (not detected) result in this assay.   Blood Culture (routine x 2)     Status: None (Preliminary result)   Collection Time: 09/30/2019  1:31 AM   Specimen: BLOOD  Result Value Ref Range Status   Specimen Description BLOOD LEFT ANTECUBITAL  Final   Special Requests   Final    BOTTLES DRAWN AEROBIC AND ANAEROBIC Blood Culture results may not be optimal due to an inadequate volume of blood received in culture bottles   Culture   Final    NO GROWTH 3 DAYS Performed at [facility], [address], [place], [place] [address]    Report Status PENDING  Incomplete  Blood Culture (routine x 2)     Status: None  (Preliminary result)   Collection Time: 09/21/2019  2:39 AM   Specimen: BLOOD  Result Value Ref Range Status   Specimen Description BLOOD RIGHT HAND  Final   Special Requests   Final    BOTTLES DRAWN AEROBIC ONLY Blood Culture results may not be optimal due to an inadequate volume of blood received in culture bottles   Culture   Final    NO GROWTH 3 DAYS Performed at [facility], [address], [place], [place] [address]    Report Status PENDING  Incomplete  MRSA PCR Screening     Status: None   Collection Time: 10/15/2019  6:37 AM   Specimen: Nasopharyngeal  Result Value Ref Range Status   MRSA by PCR NEGATIVE NEGATIVE Final    Comment:        The GeneXpert MRSA Assay (FDA approved for NASAL specimens only), is one component of a comprehensive MRSA colonization surveillance program. It is not intended to diagnose MRSA infection nor to guide or monitor treatment for MRSA infections. Performed at [facility], [address] [place], [place] [address]       Radiology Studies: No results found.   Scheduled Meds: . vitamin C  500 mg Oral Daily  . aspirin EC  81 mg Oral Daily  . Chlorhexidine Gluconate Cloth  6 each Topical Daily  . dexamethasone (DECADRON) injection  6 mg Intravenous Q24H  . enoxaparin (LOVENOX) injection  40 mg Subcutaneous Q12H  . folic acid  1 mg Oral Daily  . insulin aspart  0-9 Units Subcutaneous TID WC  . insulin detemir  5 Units Subcutaneous Q12H  . mouth rinse  15 mL Mouth Rinse BID  . multivitamin with minerals  1 tablet Oral Daily  . pregabalin  150 mg Oral BID  . thiamine  100 mg Oral Daily  . zinc sulfate  220 mg Oral Daily   Continuous Infusions: . famotidine (PEPCID) IV Stopped (10/19/19 0859)  . remdesivir 100 mg in NS 100 mL Stopped (10/19/19 1108)     LOS: 3 days     Enzo Bi, MD [facility] If 7PM-7AM, please contact night-coverage 10/19/2019, 6:22 PM

## 2019-10-19 NOTE — Progress Notes (Signed)
ANTICOAGULATION CONSULT NOTE -   Pharmacy Consult for Heparin Indication: elevated D Dimer, Covid + pt, treat as suspected PE  No Known Allergies  Patient Measurements: Height: 5\' 8"  (172.7 cm) Weight: 178 lb (80.7 kg) IBW/kg (Calculated) : 63.9 HEPARIN DW (KG): 80.1  Vital Signs: Temp: 98.9 F (37.2 C) (12/29 0451) Temp Source: Oral (12/29 0451) BP: 141/84 (12/29 0451) Pulse Rate: 100 (12/29 0451)  Labs: Recent Labs    10/17/19 0105 10/17/19 0230 10/17/19 0535 10/18/19 0653 10/18/19 1655 10/18/19 2305 10/19/19 0505  HGB  --    < > 12.1 11.7*  --   --  11.7*  HCT  --   --  35.5* 33.7*  --   --  34.1*  PLT  --   --  432* 453*  --   --  482*  HEPARINUNFRC  --   --   --  0.21* 0.33 0.33 0.18*  CREATININE 0.83  --  0.71 0.53  --   --   --    < > = values in this interval not displayed.    Estimated Creatinine Clearance: 93.8 mL/min (by C-G formula based on SCr of 0.53 mg/dL).  Medical History: Past Medical History:  Diagnosis Date  . Diabetes mellitus without complication (HCC)     Medications:  Medications Prior to Admission  Medication Sig Dispense Refill Last Dose  . empagliflozin (JARDIANCE) 25 MG TABS tablet Take 25 mg by mouth daily.   Past Week at Unknown time  . metFORMIN (GLUCOPHAGE XR) 500 MG 24 hr tablet Take 1 tablet (500 mg total) by mouth daily with breakfast. 90 tablet 1 Past Week at Unknown time  . ondansetron (ZOFRAN ODT) 8 MG disintegrating tablet Take 1 tablet (8 mg total) by mouth every 8 (eight) hours as needed for nausea or vomiting. 15 tablet 0 Past Week at Unknown time  . pregabalin (LYRICA) 150 MG capsule Take 1 capsule (150 mg total) by mouth 2 (two) times daily. 60 capsule 1 Past Week at Unknown time  . sitaGLIPtin (JANUVIA) 100 MG tablet Take 100 mg by mouth daily.   Past Week at Unknown time  . traMADol (ULTRAM) 50 MG tablet Take 1 tablet (50 mg total) by mouth every 6 (six) hours as needed. (Patient not taking: Reported on 10/04/2019) 12  tablet 0 Completed Course at Unknown time    Assessment: Pharmacy asked to initiate and monitor Heparin for suspected PE, pt is Covid +.  No anticoagulants PTA per current med list.  H&H trending down, PLT stable, no bleeding reported  Heparin Course 12/26 initiation: 5000 unit bolus, then 1300 units/hr 12/26 1258 HL= 0.51 no change 12/26 1854 HL = 0.79 dec rate to 1200 units/hr 12/27 0230 HL = 0.91 dec rate to 1050 units/hr 12/27 0910 HL= 0.93 dec rate to 900 units/hr 12/27 2009 HL = 0.23 inc rate to 1000 units/hr 12/28 0653 HL = 0.21 inc rate to 1150 units/hr 12/28 1655 HL 0.33, therapeutic x1, continue current rate 12/29 0505 HL = 0.18, subtherapeutic  Goal of Therapy:  Heparin level 0.3-0.7 units/ml Monitor platelets by anticoagulation protocol: Yes   Plan:  12/29@0505  HL = 0.18, subtherapeutic. Will bolus with 2,400 units x 1 and increase rate to 1400 units/hr.  Hgb and platelets remain stable. Will recheck HL in 6 hours.  Pearla Dubonnet, PharmD Clinical Pharmacist 10/19/2019 7:57 AM

## 2019-10-19 NOTE — Consult Note (Signed)
[place] for Electrolyte Monitoring and Replacement   Recent Labs: Potassium (mmol/L)  Date Value  10/19/2019 3.4 (L)  12/21/2011 3.5   Magnesium (mg/dL)  Date Value  10/19/2019 2.1   Calcium (mg/dL)  Date Value  10/19/2019 7.7 (L)   Calcium, Total (mg/dL)  Date Value  12/21/2011 8.0 (L)   Albumin (g/dL)  Date Value  10/19/2019 2.1 (L)  12/20/2011 4.2   Phosphorus (mg/dL)  Date Value  10/19/2019 2.9   Sodium  Date Value  10/19/2019 140 mmol/L  12/31/2018 133 (A)  12/21/2011 143 mmol/L   Corrected Ca: 9.2 mg/dL  Assessment: 48 year old female admitted with shortness of breath and emesis with COVID-19 pneumonia and DKA.  She tested positive for COVID-19 5 days prior.  In the ED, she reported vomiting with 20 episodes in a 24 hour period. Both potassium and phosphorous remain low despite 20 mmol IV potassium phosphate 12/27  Goal of Therapy:  All electrolytes wnl  Plan:   KCl 2mEq x 1 dose.  recheck electrolytes in the AM  Pearla Dubonnet, PharmD Clinical Pharmacist 10/19/2019 2:01 PM

## 2019-10-19 NOTE — Progress Notes (Signed)
[facility] corresponded with pt's nurse, Terri via Secure chat and telephone. Terri, RN stated she was comfortable removing IJ without assistance from V[facility]. Advised if she had any needs or questions to place IVT consult.

## 2019-10-20 ENCOUNTER — Inpatient Hospital Stay: Admission: AD | Admit: 2019-10-20 | Payer: 59 | Source: Other Acute Inpatient Hospital | Admitting: Internal Medicine

## 2019-10-20 ENCOUNTER — Inpatient Hospital Stay: Payer: 59

## 2019-10-20 DIAGNOSIS — J9601 Acute respiratory failure with hypoxia: Secondary | ICD-10-CM

## 2019-10-20 DIAGNOSIS — E1010 Type 1 diabetes mellitus with ketoacidosis without coma: Secondary | ICD-10-CM

## 2019-10-20 DIAGNOSIS — U071 COVID-19: Principal | ICD-10-CM

## 2019-10-20 LAB — BLOOD GAS, ARTERIAL
Acid-Base Excess: 10.1 mmol/L — ABNORMAL HIGH (ref 0.0–2.0)
Bicarbonate: 34.3 mmol/L — ABNORMAL HIGH (ref 20.0–28.0)
FIO2: 1
O2 Saturation: 88.2 %
Patient temperature: 37
pCO2 arterial: 43 mmHg (ref 32.0–48.0)
pH, Arterial: 7.51 — ABNORMAL HIGH (ref 7.350–7.450)
pO2, Arterial: 49 mmHg — ABNORMAL LOW (ref 83.0–108.0)

## 2019-10-20 LAB — CBC WITH DIFFERENTIAL/PLATELET
Abs Immature Granulocytes: 0.13 10*3/uL — ABNORMAL HIGH (ref 0.00–0.07)
Basophils Absolute: 0 10*3/uL (ref 0.0–0.1)
Basophils Relative: 0 %
Eosinophils Absolute: 0 10*3/uL (ref 0.0–0.5)
Eosinophils Relative: 0 %
HCT: 32.2 % — ABNORMAL LOW (ref 36.0–46.0)
Hemoglobin: 11.3 g/dL — ABNORMAL LOW (ref 12.0–15.0)
Immature Granulocytes: 1 %
Lymphocytes Relative: 4 %
Lymphs Abs: 0.7 10*3/uL (ref 0.7–4.0)
MCH: 28.5 pg (ref 26.0–34.0)
MCHC: 35.1 g/dL (ref 30.0–36.0)
MCV: 81.1 fL (ref 80.0–100.0)
Monocytes Absolute: 0.8 10*3/uL (ref 0.1–1.0)
Monocytes Relative: 5 %
Neutro Abs: 15.7 10*3/uL — ABNORMAL HIGH (ref 1.7–7.7)
Neutrophils Relative %: 90 %
Platelets: 465 10*3/uL — ABNORMAL HIGH (ref 150–400)
RBC: 3.97 MIL/uL (ref 3.87–5.11)
RDW: 13.8 % (ref 11.5–15.5)
WBC: 17.3 10*3/uL — ABNORMAL HIGH (ref 4.0–10.5)
nRBC: 0 % (ref 0.0–0.2)

## 2019-10-20 LAB — COMPREHENSIVE METABOLIC PANEL
ALT: 10 U/L (ref 0–44)
AST: 18 U/L (ref 15–41)
Albumin: 2 g/dL — ABNORMAL LOW (ref 3.5–5.0)
Alkaline Phosphatase: 87 U/L (ref 38–126)
Anion gap: 11 (ref 5–15)
BUN: 10 mg/dL (ref 6–20)
CO2: 26 mmol/L (ref 22–32)
Calcium: 7.6 mg/dL — ABNORMAL LOW (ref 8.9–10.3)
Chloride: 103 mmol/L (ref 98–111)
Creatinine, Ser: 0.48 mg/dL (ref 0.44–1.00)
GFR calc Af Amer: >60 mL/min (ref >60)
GFR calc non Af Amer: >60 mL/min (ref >60)
Glucose, Bld: 230 mg/dL — ABNORMAL HIGH (ref 70–99)
Potassium: 3.8 mmol/L (ref 3.5–5.1)
Sodium: 140 mmol/L (ref 135–145)
Total Bilirubin: 0.7 mg/dL (ref 0.3–1.2)
Total Protein: 6.3 g/dL — ABNORMAL LOW (ref 6.5–8.1)

## 2019-10-20 LAB — PHOSPHORUS: Phosphorus: 2.3 mg/dL — ABNORMAL LOW (ref 2.5–4.6)

## 2019-10-20 LAB — GLUCOSE, CAPILLARY
Glucose-Capillary: 192 mg/dL — ABNORMAL HIGH (ref 70–99)
Glucose-Capillary: 192 mg/dL — ABNORMAL HIGH (ref 70–99)
Glucose-Capillary: 234 mg/dL — ABNORMAL HIGH (ref 70–99)
Glucose-Capillary: 243 mg/dL — ABNORMAL HIGH (ref 70–99)
Glucose-Capillary: 268 mg/dL — ABNORMAL HIGH (ref 70–99)

## 2019-10-20 LAB — C-REACTIVE PROTEIN: CRP: 28.9 mg/dL — ABNORMAL HIGH (ref <1.0)

## 2019-10-20 LAB — FERRITIN: Ferritin: 233 ng/mL (ref 11–307)

## 2019-10-20 LAB — MAGNESIUM: Magnesium: 1.9 mg/dL (ref 1.7–2.4)

## 2019-10-20 LAB — FIBRIN DERIVATIVES D-DIMER (ARMC ONLY): Fibrin derivatives D-dimer (ARMC): 1568.3 ng/mL (FEU) — ABNORMAL HIGH (ref 0.00–499.00)

## 2019-10-20 LAB — PROCALCITONIN: Procalcitonin: 0.72 ng/mL

## 2019-10-20 MED ORDER — SODIUM CHLORIDE 0.9 % IV SOLN
1.0000 g | INTRAVENOUS | Status: DC
  Administered 2019-10-20: 1 g via INTRAVENOUS
  Filled 2019-10-20: qty 1
  Filled 2019-10-20: qty 10

## 2019-10-20 MED ORDER — COLCHICINE 0.6 MG PO TABS
0.6000 mg | ORAL_TABLET | Freq: Two times a day (BID) | ORAL | Status: DC
  Administered 2019-10-20: 0.6 mg via ORAL
  Filled 2019-10-20 (×2): qty 1

## 2019-10-20 MED ORDER — FUROSEMIDE 10 MG/ML IJ SOLN
40.0000 mg | Freq: Once | INTRAMUSCULAR | Status: AC
  Administered 2019-10-20: 40 mg via INTRAVENOUS
  Filled 2019-10-20: qty 4

## 2019-10-20 MED ORDER — DEXTROSE 5 % IV SOLN
250.0000 mg | INTRAVENOUS | Status: DC
  Administered 2019-10-20: 23:00:00 250 mg via INTRAVENOUS
  Filled 2019-10-20 (×2): qty 250

## 2019-10-20 MED ORDER — METHYLPREDNISOLONE SODIUM SUCC 125 MG IJ SOLR
60.0000 mg | Freq: Four times a day (QID) | INTRAMUSCULAR | Status: DC
  Administered 2019-10-20 – 2019-10-21 (×2): 60 mg via INTRAVENOUS
  Filled 2019-10-20 (×2): qty 2

## 2019-10-20 MED ORDER — INSULIN DETEMIR 100 UNIT/ML ~~LOC~~ SOLN
7.0000 [IU] | Freq: Two times a day (BID) | SUBCUTANEOUS | Status: DC
  Filled 2019-10-20 (×2): qty 0.07

## 2019-10-20 MED ORDER — GUAIFENESIN-CODEINE 100-10 MG/5ML PO SOLN
5.0000 mL | Freq: Four times a day (QID) | ORAL | Status: DC | PRN
  Administered 2019-10-20 – 2019-10-21 (×2): 5 mL via ORAL
  Filled 2019-10-20 (×2): qty 5

## 2019-10-20 MED ORDER — INSULIN DETEMIR 100 UNIT/ML ~~LOC~~ SOLN
15.0000 [IU] | Freq: Two times a day (BID) | SUBCUTANEOUS | Status: DC
  Administered 2019-10-20: 15 [IU] via SUBCUTANEOUS
  Filled 2019-10-20 (×3): qty 0.15

## 2019-10-20 MED ORDER — BENZONATATE 100 MG PO CAPS
200.0000 mg | ORAL_CAPSULE | Freq: Three times a day (TID) | ORAL | Status: DC | PRN
  Administered 2019-10-20: 200 mg via ORAL
  Filled 2019-10-20: qty 2

## 2019-10-20 MED ORDER — INSULIN ASPART 100 UNIT/ML ~~LOC~~ SOLN: 8.0000 [IU] | Freq: Three times a day (TID) | SUBCUTANEOUS | Status: DC

## 2019-10-20 MED ORDER — METHYLPREDNISOLONE SODIUM SUCC 125 MG IJ SOLR: 60.0000 mg | Freq: Four times a day (QID) | INTRAMUSCULAR | Status: DC

## 2019-10-20 MED ORDER — PROMETHAZINE-CODEINE 6.25-10 MG/5ML PO SYRP: 5.0000 mL | ORAL_SOLUTION | Freq: Four times a day (QID) | ORAL | Status: DC | PRN

## 2019-10-20 MED ORDER — CHLORHEXIDINE GLUCONATE CLOTH 2 % EX PADS
6.0000 | MEDICATED_PAD | Freq: Every day | CUTANEOUS | Status: DC
  Administered 2019-10-20: 6 via TOPICAL

## 2019-10-20 MED ORDER — INSULIN ASPART 100 UNIT/ML ~~LOC~~ SOLN
3.0000 [IU] | Freq: Three times a day (TID) | SUBCUTANEOUS | Status: DC
  Administered 2019-10-20 (×2): 3 [IU] via SUBCUTANEOUS
  Filled 2019-10-20 (×2): qty 1

## 2019-10-20 MED ORDER — ALPRAZOLAM 0.25 MG PO TABS
0.2500 mg | ORAL_TABLET | Freq: Two times a day (BID) | ORAL | Status: DC | PRN
  Administered 2019-10-20: 0.25 mg via ORAL
  Filled 2019-10-20: qty 1

## 2019-10-20 MED ORDER — INSULIN ASPART 100 UNIT/ML ~~LOC~~ SOLN
0.0000 [IU] | SUBCUTANEOUS | Status: DC
  Administered 2019-10-20: 7 [IU] via SUBCUTANEOUS
  Administered 2019-10-21: 4 [IU] via SUBCUTANEOUS
  Filled 2019-10-20 (×2): qty 1

## 2019-10-20 MED ORDER — FAMOTIDINE 20 MG PO TABS
20.0000 mg | ORAL_TABLET | Freq: Every day | ORAL | Status: DC
  Administered 2019-10-20: 20 mg via ORAL
  Filled 2019-10-20: qty 1

## 2019-10-20 MED ORDER — SODIUM CHLORIDE 0.9 % IV SOLN
INTRAVENOUS | Status: DC | PRN
  Administered 2019-10-20: 250 mL via INTRAVENOUS

## 2019-10-20 NOTE — Progress Notes (Signed)
Name: Jodi Rice MRN: [id number] DOB: July 14, 1971    ADMISSION DATE:  10/15/2019 CONSULTATION DATE: 10/08/2019  REFERRING MD : Dr. Karma Greaser   CHIEF COMPLAINT: Shortness of Breath   BRIEF PATIENT DESCRIPTION:  48 yo female diagnosed with COVID-19 on 12/21 admitted with acute respiratory failure in the setting of COVID-19 pneumonia and metabolic acidosis due to DKA requiring insulin gtt   SIGNIFICANT EVENTS/STUDIES:  12/26: Pt admitted to ICU with COVID-19 and DKA on insulin gtt   HISTORY OF PRESENT ILLNESS:   This is a 47 yo female with PMH of Type II Diabetes Mellitus, Peripheral Neuropathy, HTN, Hyperlipidemia, and Vitamin D Deficiency.  She presented to [facility] ER on 12/26 with c/o nausea and shortness of breath.  She was diagnosed with COVID-19 in the outpatient setting on 10/11/2019.  She previously presented to [facility] ER on 10/14/19 with c/o worsening weakness and nausea/vomiting.  Lab results at that time revealed CO2 14, anion gap 23, and glucose 154.  She did not require hospital admission on 10/14/19 and was prescribed zofran, however she was unable to fill her prescription due to the holiday.  During current ER presentation lab results revealed Na+ 146, CO2 <7, glucose 389, BUN 27, creatinine 1.27, total bilirubin 2.4, LDH 306, troponin I 20, ferritin 331, lactic acid 4.3, wbc 22.7, fibrinogen >750, d-dimer >7,500, and abg ph 7.08/pCO2 <19/pO2 80/acid-base deficit 24/bicarb 3.9.  Pt ruled in for DKA, therefore insulin gtt initiated.  CXR concerning for viral pneumonia.  Pt received ceftriaxone and azithromycin.  She was subsequently admitted to ICU for additional workup and treatment.    PAST MEDICAL HISTORY :   has a past medical history of Diabetes mellitus without complication ([place]).  has a past surgical history that includes Breast biopsy (Right, 1980's); Colonoscopy with propofol (N/A, 12/16/2017); Flexible sigmoidoscopy (N/A, 02/26/2018); Flexible sigmoidoscopy (N/A,  11/17/2018); and Tibia fracture surgery. Prior to Admission medications   Medication Sig Start Date End Date Taking? Authorizing Provider  aspirin EC 325 MG tablet Take by mouth.    [provider]  empagliflozin (JARDIANCE) 25 MG TABS tablet Take 25 mg by mouth daily.    [provider]  metFORMIN (GLUCOPHAGE XR) 500 MG 24 hr tablet Take 1 tablet (500 mg total) by mouth daily with breakfast. 07/12/19   Elby Beck, FNP  ondansetron (ZOFRAN ODT) 8 MG disintegrating tablet Take 1 tablet (8 mg total) by mouth every 8 (eight) hours as needed for nausea or vomiting. 10/14/19   Arta Silence, MD  pregabalin (LYRICA) 150 MG capsule Take 1 capsule (150 mg total) by mouth 2 (two) times daily. 06/18/19   Edrick Kins, DPM  sitaGLIPtin (JANUVIA) 100 MG tablet Take 100 mg by mouth daily.    [provider]  traMADol (ULTRAM) 50 MG tablet Take 1 tablet (50 mg total) by mouth every 6 (six) hours as needed. 05/02/18   Loney Hering, MD   No Known Allergies  FAMILY HISTORY:  family history includes Breast cancer (age of onset: 69) in her maternal aunt; Diabetes in her mother and sister; Hypertension in her sister. SOCIAL HISTORY:  reports that she quit smoking about 33 years ago. Her smoking use included cigarettes. She has a 2.00 pack-year smoking history. She has never used smokeless tobacco. She reports that she does not drink alcohol or use drugs.  REVIEW OF SYSTEMS: Positives in BOLD  Constitutional: Negative for fever, chills, weight loss, malaise/fatigue and diaphoresis.  HENT: Negative for hearing loss, ear  pain, nosebleeds, congestion, sore throat, neck pain, tinnitus and ear discharge.   Eyes: Negative for blurred vision, double vision, photophobia, pain, discharge and redness.  Respiratory: cough, hemoptysis, sputum production, shortness of breath, wheezing and stridor.   Cardiovascular: Negative for chest pain, palpitations, orthopnea, claudication, leg  swelling and PND.  Gastrointestinal: heartburn, nausea, vomiting, abdominal pain, diarrhea, constipation, blood in stool and melena.  Genitourinary: Negative for dysuria, urgency, frequency, hematuria and flank pain.  Musculoskeletal: Negative for myalgias, back pain, joint pain and falls.  Skin: Negative for itching and rash.  Neurological: Negative for dizziness, tingling, tremors, sensory change, speech change, focal weakness, seizures, loss of consciousness, weakness and headaches.  Endo/Heme/Allergies: Negative for environmental allergies and polydipsia. Does not bruise/bleed easily.  SUBJECTIVE:  No complaints at this time   VITAL SIGNS: Temp:  [98 F (36.7 C)-100.2 F (37.9 C)] 98.1 F (36.7 C) (12/30 1538) Pulse Rate:  [102-121] 117 (12/30 1600) Resp:  [20-38] 34 (12/30 1600) BP: (132-147)/(79-84) 137/81 (12/30 1538) SpO2:  [79 %-95 %] 85 % (12/30 1600) FiO2 (%):  [100 %] 100 % (12/30 1538)  PHYSICAL EXAMINATION: General: acutely ill appearing female, in mild respiratory distress  Neuro: alert and oriented, follows commands  HEENT: supple, no JVD  Cardiovascular: sinus tachycardia, no R/G Lungs: faint rhonchi throughout, slightly labored and tachypneic  Abdomen: +BS x4, soft, non tender, non distended  Musculoskeletal: normal bulk and tone, no edema  Skin: intact no rashes or lesions present   Recent Labs  Lab 10/18/19 0653 10/19/19 0505 10/20/19 0525  NA 139 140 140  K 3.2* 3.4* 3.8  CL 110 106 103  CO2 22 23 26   BUN 16 9 10   CREATININE 0.53 0.51 0.48  GLUCOSE 176* 211* 230*   Recent Labs  Lab 10/18/19 0653 10/19/19 0505 10/20/19 0525  HGB 11.7* 11.7* 11.3*  HCT 33.7* 34.1* 32.2*  WBC 16.7* 15.1* 17.3*  PLT 453* 482* 465*   CT ANGIO CHEST PE W OR WO CONTRAST  Result Date: 10/19/2019 CLINICAL DATA:  Hypoxemia EXAM: CT ANGIOGRAPHY CHEST WITH CONTRAST TECHNIQUE: Multidetector CT imaging of the chest was performed using the standard protocol during bolus  administration of intravenous contrast. Multiplanar CT image reconstructions and MIPs were obtained to evaluate the vascular anatomy. CONTRAST:  70mL OMNIPAQUE IOHEXOL 350 MG/ML SOLN COMPARISON:  None. FINDINGS: Cardiovascular: Satisfactory opacification of the pulmonary arteries to the segmental level. No evidence of pulmonary embolism. Normal heart size. No pericardial effusion. Mediastinum/Nodes: Enlarged subcarinal lymph node measuring 18 mm in short axis. Mild bilateral hilar lymphadenopathy. No axillary lymphadenopathy. Mild esophageal wall thickening as can be seen with esophagitis. Normal thyroid gland. Normal trachea. Lungs/Pleura: Bilateral patchy areas of interstitial thickening and patchy areas of ground-glass opacities throughout bilateral lungs. Trace bilateral pleural effusions. No pneumothorax. Upper Abdomen: No acute upper abdominal abnormality. 3.2 x 2.6 cm pancreatic tail mass concerning for pancreatic malignancy until proven otherwise. Recommend further characterization with a non emergent MRI of the abdomen without and with intravenous contrast. Musculoskeletal: No acute osseous abnormality. No aggressive osseous lesion. Review of the MIP images confirms the above findings. IMPRESSION: 1. No evidence of pulmonary embolus. 2. Bilateral patchy interstitial thickening and ground-glass opacities throughout bilateral lungs as can be seen in multilobar pneumonia including atypical viral pneumonia such as COVID-19. 3. 3.2 x 2.6 cm pancreatic tail mass concerning for pancreatic malignancy until proven otherwise. Recommend further characterization with a non emergent MRI of the abdomen without and with intravenous contrast. Electronically Signed  By: Kathreen Devoid   On: 10/19/2019 18:59   DG Chest Port 1 View  Result Date: 10/20/2019 CLINICAL DATA:  Hypoxia EXAM: PORTABLE CHEST 1 VIEW COMPARISON:  October 16, 2019 FINDINGS: The central venous catheter has been removed. There is scattered bilateral  pulmonary opacities which appear to have worsened since the prior chest x-ray dated 10/08/2019. There is no pneumothorax. No large pleural effusion. The heart size is normal. There is no acute osseous abnormality. IMPRESSION: 1. Worsening scattered bilateral pulmonary opacities since the prior chest x-ray dated 10/15/2019. 2. No pneumothorax. Status post removal of the right-sided central venous catheter. Electronically Signed   By: Constance Holster M.D.   On: 10/20/2019 15:45       ASSESSMENT / PLAN:  Acute respiratory failure secondary to COVID-19 pneumonia  Prn supplemental O2 for dyspnea and/or hypoxia  Increase solumedrol to  Prn bronchodilator therapy   If pt hypoxic will instruct to self-prone as tolerated Pulmonary hygiene  Maintain airborne and contact precautions  Trend WBC and monitor fever curve  Trend PCT  Follow cultures cxr with worsening infiltrates - increasing steroid regimen and diuresis  Acute renal failure-resolved   Diabetic ketoacidosis -resolved  Mass of pancreatic tail  - will discuss post hospitalization on outpatient basis  Best Practice: VTE px: heparin gtt  SUP px: iv pepcid Diet: NPO for now while on insulin gtt

## 2019-10-20 NOTE — Consult Note (Addendum)
[place] for Electrolyte Monitoring and Replacement   Recent Labs: Potassium (mmol/L)  Date Value  10/20/2019 3.8  12/21/2011 3.5   Magnesium (mg/dL)  Date Value  10/20/2019 1.9   Calcium (mg/dL)  Date Value  10/20/2019 7.6 (L)   Calcium, Total (mg/dL)  Date Value  12/21/2011 8.0 (L)   Albumin (g/dL)  Date Value  10/20/2019 2.0 (L)  12/20/2011 4.2   Phosphorus (mg/dL)  Date Value  10/20/2019 2.3 (L)   Sodium  Date Value  10/20/2019 140 mmol/L  12/31/2018 133 (A)  12/21/2011 143 mmol/L   Corrected Ca: 9.2 mg/dL  Assessment: 48 year old female admitted with shortness of breath and emesis with COVID-19 pneumonia and DKA.  She tested positive for COVID-19 5 days prior.  In the ED, she reported vomiting with 20 episodes in a 24 hour period. This appears to have since resolved. She received 40 mg IV furosemide this morning  Goal of Therapy:  All electrolytes wnl  Plan:   No electrolyte replacement warranted today  recheck electrolytes in the AM  Dallie Piles, PharmD Clinical Pharmacist 10/20/2019 7:39 AM

## 2019-10-20 NOTE — Progress Notes (Addendum)
Pt O2 saturations maintaining in the 70's, however pt continues to remain asymptomatic (mentating, minimal assessory muscle use).  Called and discussed with Dr. Lucile Shutters of [facility] to confer as whether to proceed with intubation, or for any additional recommendations.  Per Dr. Lucile Shutters, give an additional 40 mg Lasix and check BNP and BMP.  Hold off on intubation and allow for permissive hypoxia given she is asymptomatic.  She is high risk for intubation.    Darel Hong, AGACNP-BC [facility] Pager: [phone]

## 2019-10-20 NOTE — Progress Notes (Signed)
PROGRESS NOTE    Jodi Rice  [id number] DOB: December 09, 1970 DOA: 10/18/2019 PCP: Elby Beck, FNP   Brief Narrative:  Jodi Rice is a 48 y.o. AA female with medical history significant for Type II Diabetes Mellitus, Peripheral Neuropathy, HTN, Hyperlipidemia, and Vitamin D Deficiency. She presented to [facility] ER on 12/26 with c/o nausea and shortness of breath. She was diagnosed with COVID-19 in the outpatient setting on 10/11/2019. She previously presented to [facility] ER on 10/14/19 with c/o worsening weakness andnausea/vomiting.   During current ER presentation lab results revealed Na+ 146, CO2 <7, glucose 389, BUN 27, creatinine 1.27, total bilirubin 2.4, LDH 306, troponin I 20, ferritin 331, lactic acid 4.3, wbc 22.7, fibrinogen >750, d-dimer >7,500, and abg ph 7.08/pCO2 <19/pO2 80/acid-base deficit 24/bicarb 3.9. Pt ruled in for DKA, therefore insulin gtt initiated. CXR concerning for viral pneumonia. Pt received ceftriaxone and azithromycin. She was subsequently admitted to ICU for additional workup and treatment.   12/30: Patient's respiratory status worsening this morning.  Early this morning she required between 8 and 10L high flow nasal cannula.  This rapidly increased to 14 L.  Attempted Lasix challenge with 40 mg IV.  Patient diuresed approximately 1000 cc (500 cc collected, 500cc incontinent in bed) however saturations continued to worsen.  Saturations worsened to the point the patient required 55 L of warmed high flow nasal cannula.  Tachycardic and tachypneic as well.  No obvious use of accessory muscle.  Able to speak in short sentences.  Saturations worsened on exertional while coughing.  ICU bed requested.  Message sent to ICU attending to evaluate.  Appreciate assistance from Dr. Lanney Gins and team.  Patient will be transferred to the intensive care unit at this time.   Assessment & Plan:   Active Problems:   COVID-19  Acute respiratory failure  secondary to COVID-19pneumonia --Pt had no O2 requirement on presentation, however, needed 2L after transferring out of ICU and today up to 10L.  Maybe be fluid overload since pt had been on MIVF since DKA, but also need to consider PE since D-dimer very elevated on presentation. --azithromycin and ceftriaxone d/c'ed since procal neg --Heparin gtt for DVT ppx d/c'ed 12/30: Worsening oxygen saturation.  On 55 L heated high flow nasal cannula.  ICU consulted.  Patient to be transferred.  Previous plan to transfer patient to [facility] on hold given high O2 requirements. PLAN: --Transfer to ICU --Continue decadron and remdesivir (day 4) --vitamin C, mvi, thiamine, zinc, and folic acid  --Prn bronchodilator therapy  --Trend inflammatory markers  --Pulmonary hygiene  --IV lasix 40 mg x1 (second dose in last 2 days, patient effectively diureses, but no effect on O2 sat)  Acute renal failure secondary to hypovolemia in setting of DKA, resolved  On presentation, Cr 1.27 (baseline 0.6).  Resolved with IVF hydration.  Lactic acidosis, resolved Lactic acid 4.3 on presentation, normalized with IVF hydration.  Diabetic ketoacidosis, resolved Hx: peripheral neuropathy  S/p DKA insulin gtt protocal, with gap closed. --Diabetes coordinator consulted appreciate input  --Continue outpatient lyrica  --Levemir 7u BID --SSI TID  Incidentally discovered pancreatic mass Found on chest CT Concerning for malignancy Discussed with patient She will need non-emergent MRI abd/pelvis once acute respiratory issues resolved   DVT prophylaxis: Lovenox SQ Code Status: Full code  Family Communication: spoke with daughter Edwin Dada over phone Disposition Plan: pending clinical course  Consultants:   ICU- [facility]  Procedures: none Antimicrobials: remdesevir (12/26- )  Subjective: Seen and examined Oxygen  requirements continue to rise Patient appears short of breath Endorsing  cough  Objective: Vitals:   10/20/19 0725 10/20/19 1211 10/20/19 1220 10/20/19 1232  BP:  (!) 147/84    Pulse: (!) 102 (!) 120  (!) 115  Resp:  (!) 23    Temp:  98 F (36.7 C)    TempSrc:  Oral    SpO2:  (!) 79% (!) 88% 91%  Weight:      Height:        Intake/Output Summary (Last 24 hours) at 10/20/2019 1540 Last data filed at 10/19/2019 2030 Gross per 24 hour  Intake --  Output 200 ml  Net -200 ml   Filed Weights   10/15/19 2055  Weight: 80.7 kg    Examination:  Constitutional: NAD, AAOx3 HEENT: conjunctivae and lids normal, EOMI CV: RRR no M,R,G. Distal pulses +2.  No cyanosis.   RESP: crackles at posterior bases, normal respiratory effort, on 2L GI: +BS, NTND Extremities: No effusions, edema, or tenderness in BLE SKIN: warm, dry and intact Neuro: II - XII grossly intact.  Sensation intact Psych: Normal mood and affect.  Appropriate judgement and reason    Data Reviewed: I have personally reviewed following labs and imaging studies  CBC: Recent Labs  Lab 10/13/2019 0239 10/17/19 0535 10/18/19 0653 10/19/19 0505 10/20/19 0525  WBC 22.7* 23.1* 16.7* 15.1* 17.3*  NEUTROABS 19.6* 20.5* 14.4* 13.1* 15.7*  HGB 14.4 12.1 11.7* 11.7* 11.3*  HCT 44.9 35.5* 33.7* 34.1* 32.2*  MCV 89.1 84.5 83.4 84.2 81.1  PLT 471* 432* 453* 482* [id number]*   Basic Metabolic Panel: Recent Labs  Lab 10/17/19 0105 10/17/19 0535 10/18/19 0653 10/19/19 0505 10/20/19 0525  NA 144 143 139 140 140  K 3.6 3.8 3.2* 3.4* 3.8  CL 114* 113* 110 106 103  CO2 20* 20* 22 23 26   GLUCOSE 160* 172* 176* 211* 230*  BUN 26* 25* 16 9 10   CREATININE 0.83 0.71 0.53 0.51 0.48  CALCIUM 8.0* 7.9* 7.7* 7.7* 7.6*  MG  --  2.3 2.0 2.1 1.9  PHOS  --  1.6* 1.3* 2.9 2.3*   GFR: Estimated Creatinine Clearance: 93.8 mL/min (by C-G formula based on SCr of 0.48 mg/dL). Liver Function Tests: Recent Labs  Lab 10/20/2019 0239 10/17/19 0535 10/18/19 0653 10/19/19 0505 10/20/19 0525  AST 31 21 17  13* 18   ALT 16 10 11 9 10   ALKPHOS 94 76 75 79 87  BILITOT 2.4* 0.8 0.5 0.7 0.7  PROT 7.6 6.0* 5.6* 6.1* 6.3*  ALBUMIN 3.0* 2.2* 2.1* 2.1* 2.0*   Recent Labs  Lab 10/14/19 1250  LIPASE 14   No results for input(s): AMMONIA in the last 168 hours. Coagulation Profile: Recent Labs  Lab 10/12/2019 0746  INR 1.1   Cardiac Enzymes: No results for input(s): CKTOTAL, CKMB, CKMBINDEX, TROPONINI in the last 168 hours. BNP (last 3 results) No results for input(s): PROBNP in the last 8760 hours. HbA1C: No results for input(s): HGBA1C in the last 72 hours. CBG: Recent Labs  Lab 10/19/19 1202 10/19/19 1652 10/19/19 2134 10/20/19 0814 10/20/19 1148  GLUCAP 312* 214* 260* 192* 268*   Lipid Profile: No results for input(s): CHOL, HDL, LDLCALC, TRIG, CHOLHDL, LDLDIRECT in the last 72 hours. Thyroid Function Tests: No results for input(s): TSH, T4TOTAL, FREET4, T3FREE, THYROIDAB in the last 72 hours. Anemia Panel: Recent Labs    10/19/19 0505 10/20/19 0525  FERRITIN 190 233   Sepsis Labs: Recent Labs  Lab 10/14/2019 0239 10/08/2019  [id number] 09/30/2019 1257  PROCALCITON 0.23  --   --   LATICACIDVEN 4.3* 2.4* 1.1    Recent Results (from the past 240 hour(s))  Novel Coronavirus, NAA ([facility])     Status: Abnormal   Collection Time: 10/11/19  1:37 PM   Specimen: Nasopharyngeal(NP) swabs in vial transport medium   NASOPHARYNGE  TESTING  Result Value Ref Range Status   SARS-CoV-2, NAA Detected (A) Not Detected Final    Comment: This nucleic acid amplification test was developed and its performance characteristics determined by [employer]. Nucleic acid amplification tests include PCR and TMA. This test has not been FDA cleared or approved. This test has been authorized by FDA under an Emergency Use Authorization (EUA). This test is only authorized for the duration of time the declaration that circumstances exist justifying the authorization of the emergency use of in  vitro diagnostic tests for detection of SARS-CoV-2 virus and/or diagnosis of COVID-19 infection under section 564(b)(1) of the Act, 21 U.S.C. [id number]) (1), unless the authorization is terminated or revoked sooner. When diagnostic testing is negative, the possibility of a false negative result should be considered in the context of a patient's recent exposures and the presence of clinical signs and symptoms consistent with COVID-19. An individual without symptoms of COVID-19 and who is not shedding SARS-CoV-2 virus would  expect to have a negative (not detected) result in this assay.   Blood Culture (routine x 2)     Status: None (Preliminary result)   Collection Time: 10/09/2019  1:31 AM   Specimen: BLOOD  Result Value Ref Range Status   Specimen Description BLOOD LEFT ANTECUBITAL  Final   Special Requests   Final    BOTTLES DRAWN AEROBIC AND ANAEROBIC Blood Culture results may not be optimal due to an inadequate volume of blood received in culture bottles   Culture   Final    NO GROWTH 3 DAYS Performed at [facility], [address], [place], [place] [address]    Report Status PENDING  Incomplete  Blood Culture (routine x 2)     Status: None (Preliminary result)   Collection Time: 10/12/2019  2:39 AM   Specimen: BLOOD  Result Value Ref Range Status   Specimen Description BLOOD RIGHT HAND  Final   Special Requests   Final    BOTTLES DRAWN AEROBIC ONLY Blood Culture results may not be optimal due to an inadequate volume of blood received in culture bottles   Culture   Final    NO GROWTH 3 DAYS Performed at [facility], [address] [place], [place] [address]    Report Status PENDING  Incomplete  MRSA PCR Screening     Status: None   Collection Time: 10/06/2019  6:37 AM   Specimen: Nasopharyngeal  Result Value Ref Range Status   MRSA by PCR NEGATIVE NEGATIVE Final    Comment:        The GeneXpert MRSA Assay (FDA approved for NASAL specimens only), is  one component of a comprehensive MRSA colonization surveillance program. It is not intended to diagnose MRSA infection nor to guide or monitor treatment for MRSA infections. Performed at [facility], [address] [place], [place] [address]          Radiology Studies: CT ANGIO CHEST PE W OR WO CONTRAST  Result Date: 10/19/2019 CLINICAL DATA:  Hypoxemia EXAM: CT ANGIOGRAPHY CHEST WITH CONTRAST TECHNIQUE: Multidetector CT imaging of the chest was performed using the standard protocol during bolus administration  of intravenous contrast. Multiplanar CT image reconstructions and MIPs were obtained to evaluate the vascular anatomy. CONTRAST:  18mL OMNIPAQUE IOHEXOL 350 MG/ML SOLN COMPARISON:  None. FINDINGS: Cardiovascular: Satisfactory opacification of the pulmonary arteries to the segmental level. No evidence of pulmonary embolism. Normal heart size. No pericardial effusion. Mediastinum/Nodes: Enlarged subcarinal lymph node measuring 18 mm in short axis. Mild bilateral hilar lymphadenopathy. No axillary lymphadenopathy. Mild esophageal wall thickening as can be seen with esophagitis. Normal thyroid gland. Normal trachea. Lungs/Pleura: Bilateral patchy areas of interstitial thickening and patchy areas of ground-glass opacities throughout bilateral lungs. Trace bilateral pleural effusions. No pneumothorax. Upper Abdomen: No acute upper abdominal abnormality. 3.2 x 2.6 cm pancreatic tail mass concerning for pancreatic malignancy until proven otherwise. Recommend further characterization with a non emergent MRI of the abdomen without and with intravenous contrast. Musculoskeletal: No acute osseous abnormality. No aggressive osseous lesion. Review of the MIP images confirms the above findings. IMPRESSION: 1. No evidence of pulmonary embolus. 2. Bilateral patchy interstitial thickening and ground-glass opacities throughout bilateral lungs as can be seen in multilobar pneumonia including  atypical viral pneumonia such as COVID-19. 3. 3.2 x 2.6 cm pancreatic tail mass concerning for pancreatic malignancy until proven otherwise. Recommend further characterization with a non emergent MRI of the abdomen without and with intravenous contrast. Electronically Signed   By: Kathreen Devoid   On: 10/19/2019 18:59        Scheduled Meds: . vitamin C  500 mg Oral Daily  . aspirin EC  81 mg Oral Daily  . dexamethasone (DECADRON) injection  6 mg Intravenous Q24H  . enoxaparin (LOVENOX) injection  40 mg Subcutaneous Q12H  . famotidine  20 mg Oral Daily  . folic acid  1 mg Oral Daily  . insulin aspart  0-9 Units Subcutaneous TID WC  . insulin aspart  3 Units Subcutaneous TID WC  . insulin detemir  7 Units Subcutaneous Q12H  . mouth rinse  15 mL Mouth Rinse BID  . multivitamin with minerals  1 tablet Oral Daily  . pregabalin  150 mg Oral BID  . thiamine  100 mg Oral Daily  . zinc sulfate  220 mg Oral Daily   Continuous Infusions: . sodium chloride 250 mL (10/20/19 0842)     LOS: 4 days    Time spent: 35 minutes    Sidney Ace, MD [facility] Pager [phone]  If 7PM-7AM, please contact night-coverage [email or web address] Password [email or web address] 10/20/2019, 3:40 PM

## 2019-10-20 NOTE — Progress Notes (Signed)
Spoke with patient's daughter, Dolly Rias [id number]) to notify her of patient's transfer to ICU, per patient request.  Daughter verbalized understanding and appreciation of the call.

## 2019-10-20 NOTE — Progress Notes (Signed)
Pt with tachypnea (RR 22-35), tachycardia (HR 120-130), normotensive.  She is on 100% HFNC and NRB.  However she is not having significant assessory muscle use, and she is able to talk in full sentences.  She is also mentating (A&O x4).  Trial of diuresis was given earlier today with approximately 1L urine output, however FiO2 requirements have not improved.  CXR from today showed worsening bilateral opacities.  CTA Chest yesterday 12/29 was negative for PE, with FDP's trending down.  Given her progressive opacities, checked procalcitonin, which has increased to 0.72.  Will place her on empiric CAP coverage with Azithromycin & Rocephin for now.  Will obtain sputum culture, strep pneumo urinary antigen, and legionella urinary antigen.  Have also offered trying Colchicine, of which the pt has agreed to.  [facility] has called to infer about her transferring.  However, have concerns about her ability to tolerate the trip without intubation given her severe desaturations with any exertion.  Given that she is so alert and mentating, have discussed with her that she may need intubation for trip over to [facility].  She has decided that does not want to be intubated for now, and that she would like to hold off on transfer to [facility] for now.  She is in agreement for IV antibiotics, colchicine, and previous treatment measures.  Have encouraged incentive spirometry, flutter valve, and self proning as tolerated.  Continue to monitor.     Darel Hong, AGACNP-BC [facility] Pager: [phone]

## 2019-10-20 NOTE — Progress Notes (Signed)
Inpatient Diabetes Program Recommendations  AACE/ADA: New Consensus Statement on Inpatient Glycemic Control (2015)  Target Ranges:  Prepandial:   less than 140 mg/dL      Peak postprandial:   less than 180 mg/dL (1-2 hours)      Critically ill patients:  140 - 180 mg/dL   Lab Results  Component Value Date   GLUCAP 192 (H) 10/20/2019   HGBA1C 10.7 06/07/2019    Review of Glycemic Control Results for COLLEEN, VANLOAN (MRN [id number]) as of 10/20/2019 09:27  Ref. Range 10/19/2019 07:47 10/19/2019 12:02 10/19/2019 16:52 10/19/2019 21:34 10/20/2019 08:14  Glucose-Capillary Latest Ref Range: 70 - 99 mg/dL 215 (H)  NOVOLOG 3units + LEVEMIR 5units 312 (H)  NOVOLOG 7units 214 (H)  NOVOLOG 3units 260 (H)  LEVEMIR 5units 192 (H)  NOVOLOG 2units + LEVEMIR 5 units    Diabetes history: DM2 Outpatient Diabetes medications: Jardiance 25 mg daily; Metformin XR 500 daily; Januvia 100 mg daily Current orders for Inpatient glycemic control: Novolog 0-9 units TID with meals; Levemir 5 units twice daily; Decadron 6 mg daily  Inpatient Diabetes Program Recommendations:  Please consider: - increasing Levemir 7 units BID  -Novolog meal coverage 3 units TID with meals if eats at least 50% of meal  Thank you, Geoffry Paradise, RN, BSN Diabetes Coordinator Inpatient Diabetes Program [phone] (team pager from 8a-5p)

## 2019-10-20 NOTE — Progress Notes (Signed)
MEDICATION RELATED CONSULT NOTE - INITIAL   Pharmacy Consult for monitoring of insulin regimen with steroid dose increase Indication: covid +, on steroids   No Known Allergies  Patient Measurements: Height: 5\' 8"  (172.7 cm) Weight: 178 lb (80.7 kg) IBW/kg (Calculated) : 63.9 Adjusted Body Weight:    Labs: Recent Labs    10/18/19 0653 10/19/19 0505 10/20/19 0525  WBC 16.7* 15.1* 17.3*  HGB 11.7* 11.7* 11.3*  HCT 33.7* 34.1* 32.2*  PLT 453* 482* 465*  CREATININE 0.53 0.51 0.48  MG 2.0 2.1 1.9  PHOS 1.3* 2.9 2.3*  ALBUMIN 2.1* 2.1* 2.0*  PROT 5.6* 6.1* 6.3*  AST 17 13* 18  ALT 11 9 10   ALKPHOS 75 79 87  BILITOT 0.5 0.7 0.7   Estimated Creatinine Clearance: 93.8 mL/min (by C-G formula based on SCr of 0.48 mg/dL).   Medical History: Past Medical History:  Diagnosis Date  . Diabetes mellitus without complication (HCC)     Medications:  Scheduled:  . vitamin C  500 mg Oral Daily  . aspirin EC  81 mg Oral Daily  . Chlorhexidine Gluconate Cloth  6 each Topical Daily  . enoxaparin (LOVENOX) injection  40 mg Subcutaneous Q12H  . famotidine  20 mg Oral Daily  . folic acid  1 mg Oral Daily  . insulin aspart  0-9 Units Subcutaneous TID WC  . [START ON October 30, 2019] insulin aspart  8 Units Subcutaneous TID WC  . insulin detemir  15 Units Subcutaneous Q12H  . mouth rinse  15 mL Mouth Rinse BID  . methylPREDNISolone (SOLU-MEDROL) injection  60 mg Intravenous Q6H  . multivitamin with minerals  1 tablet Oral Daily  . pregabalin  150 mg Oral BID  . thiamine  100 mg Oral Daily  . zinc sulfate  220 mg Oral Daily   Infusions:  . sodium chloride 250 mL (10/20/19 0842)    Assessment: 48 yo F covid positive on dexamethasone 6 mg IV q24h being transitioned to methylprednisolone 60 mg IV q6h. MD request to monitor insulin regimen (had DKA last week)  Goal of Therapy: per Diabetes coordinator note: Target Ranges:  Prepandial:              less than 140 mg/dL        Peak postprandial:    less than 180 mg/dL (1-2 hours)                            Critically ill patients:  140 - 180 mg/dL  Plan:  Patient currently on SSI sensitive scale and Novolog 3 units TID w/ meals and Levemir 7 units q12h. -MD has now changed to Levemir 15 units q12h and Novolog 8 units TID/w Barrie Lyme. F/u Blood Glucose   Oberia Beaudoin A 10/20/2019,5:52 PM

## 2019-10-21 LAB — CBC WITH DIFFERENTIAL/PLATELET
Abs Immature Granulocytes: 0.13 10*3/uL — ABNORMAL HIGH (ref 0.00–0.07)
Basophils Absolute: 0 10*3/uL (ref 0.0–0.1)
Basophils Relative: 0 %
Eosinophils Absolute: 0 10*3/uL (ref 0.0–0.5)
Eosinophils Relative: 0 %
HCT: 34 % — ABNORMAL LOW (ref 36.0–46.0)
Hemoglobin: 11.3 g/dL — ABNORMAL LOW (ref 12.0–15.0)
Immature Granulocytes: 1 %
Lymphocytes Relative: 4 %
Lymphs Abs: 0.6 10*3/uL — ABNORMAL LOW (ref 0.7–4.0)
MCH: 28.2 pg (ref 26.0–34.0)
MCHC: 33.2 g/dL (ref 30.0–36.0)
MCV: 84.8 fL (ref 80.0–100.0)
Monocytes Absolute: 0.4 10*3/uL (ref 0.1–1.0)
Monocytes Relative: 3 %
Neutro Abs: 15.6 10*3/uL — ABNORMAL HIGH (ref 1.7–7.7)
Neutrophils Relative %: 92 %
Platelets: 401 10*3/uL — ABNORMAL HIGH (ref 150–400)
RBC: 4.01 MIL/uL (ref 3.87–5.11)
RDW: 13.5 % (ref 11.5–15.5)
WBC: 16.8 10*3/uL — ABNORMAL HIGH (ref 4.0–10.5)
nRBC: 0.2 % (ref 0.0–0.2)

## 2019-10-21 LAB — CULTURE, BLOOD (ROUTINE X 2)
Culture: NO GROWTH
Culture: NO GROWTH

## 2019-10-21 LAB — COMPREHENSIVE METABOLIC PANEL
ALT: 10 U/L (ref 0–44)
AST: 23 U/L (ref 15–41)
Albumin: 2 g/dL — ABNORMAL LOW (ref 3.5–5.0)
Alkaline Phosphatase: 79 U/L (ref 38–126)
Anion gap: 6 (ref 5–15)
BUN: 12 mg/dL (ref 6–20)
CO2: 34 mmol/L — ABNORMAL HIGH (ref 22–32)
Calcium: 7.8 mg/dL — ABNORMAL LOW (ref 8.9–10.3)
Chloride: 104 mmol/L (ref 98–111)
Creatinine, Ser: 0.64 mg/dL (ref 0.44–1.00)
GFR calc Af Amer: >60 mL/min (ref >60)
GFR calc non Af Amer: >60 mL/min (ref >60)
Glucose, Bld: 148 mg/dL — ABNORMAL HIGH (ref 70–99)
Potassium: 3.6 mmol/L (ref 3.5–5.1)
Sodium: 144 mmol/L (ref 135–145)
Total Bilirubin: 0.4 mg/dL (ref 0.3–1.2)
Total Protein: 6.5 g/dL (ref 6.5–8.1)

## 2019-10-21 LAB — PROCALCITONIN: Procalcitonin: <0.1 ng/mL

## 2019-10-21 LAB — FERRITIN: Ferritin: 218 ng/mL (ref 11–307)

## 2019-10-21 LAB — HEMOGLOBIN A1C
Hgb A1c MFr Bld: 9.6 % — ABNORMAL HIGH (ref 4.8–5.6)
Mean Plasma Glucose: 228.82 mg/dL

## 2019-10-21 LAB — TRIGLYCERIDES: Triglycerides: 71 mg/dL (ref <150)

## 2019-10-21 LAB — STREP PNEUMONIAE URINARY ANTIGEN: Strep Pneumo Urinary Antigen: NEGATIVE

## 2019-10-21 LAB — MAGNESIUM: Magnesium: 1.8 mg/dL (ref 1.7–2.4)

## 2019-10-21 LAB — FIBRIN DERIVATIVES D-DIMER (ARMC ONLY): Fibrin derivatives D-dimer (ARMC): 6907.47 ng/mL (FEU) — ABNORMAL HIGH (ref 0.00–499.00)

## 2019-10-21 LAB — GLUCOSE, CAPILLARY: Glucose-Capillary: 168 mg/dL — ABNORMAL HIGH (ref 70–99)

## 2019-10-21 LAB — BRAIN NATRIURETIC PEPTIDE: B Natriuretic Peptide: 187 pg/mL — ABNORMAL HIGH (ref 0.0–100.0)

## 2019-10-21 LAB — PHOSPHORUS: Phosphorus: 1.4 mg/dL — ABNORMAL LOW (ref 2.5–4.6)

## 2019-10-21 LAB — C-REACTIVE PROTEIN: CRP: 30.3 mg/dL — ABNORMAL HIGH (ref <1.0)

## 2019-10-21 MED ORDER — MORPHINE SULFATE (PF) 2 MG/ML IV SOLN
2.0000 mg | Freq: Once | INTRAVENOUS | Status: AC
  Administered 2019-10-21: 2 mg via INTRAVENOUS

## 2019-10-21 MED ORDER — FENTANYL BOLUS VIA INFUSION
50.0000 ug | INTRAVENOUS | Status: DC | PRN
  Filled 2019-10-21: qty 50

## 2019-10-21 MED ORDER — SODIUM CHLORIDE 0.9 % IV SOLN
0.0000 ug/min | INTRAVENOUS | Status: DC
  Filled 2019-10-21: qty 1

## 2019-10-21 MED ORDER — ATROPINE SULFATE 1 MG/10ML IJ SOSY
PREFILLED_SYRINGE | INTRAMUSCULAR | Status: AC
  Filled 2019-10-21: qty 10

## 2019-10-21 MED ORDER — EPINEPHRINE PF 1 MG/ML IJ SOLN
0.5000 ug/min | INTRAVENOUS | Status: DC
  Filled 2019-10-21: qty 4

## 2019-10-21 MED ORDER — POTASSIUM PHOSPHATES 15 MMOLE/5ML IV SOLN: 30.0000 mmol | Freq: Once | INTRAVENOUS | Status: DC

## 2019-10-21 MED ORDER — MORPHINE SULFATE (PF) 2 MG/ML IV SOLN
INTRAVENOUS | Status: AC
  Filled 2019-10-21: qty 1

## 2019-10-21 MED ORDER — CALCIUM GLUCONATE 10 % IV SOLN
1.0000 g | Freq: Once | INTRAVENOUS | Status: DC
  Filled 2019-10-21: qty 10

## 2019-10-21 MED ORDER — FENTANYL CITRATE (PF) 100 MCG/2ML IJ SOLN
100.0000 ug | Freq: Once | INTRAMUSCULAR | Status: DC
  Filled 2019-10-21: qty 2

## 2019-10-21 MED ORDER — ETOMIDATE 2 MG/ML IV SOLN
20.0000 mg | Freq: Once | INTRAVENOUS | Status: AC
  Administered 2019-10-21: 20 mg via INTRAVENOUS
  Filled 2019-10-21: qty 20

## 2019-10-21 MED ORDER — FENTANYL 2500MCG IN NS 250ML (10MCG/ML) PREMIX INFUSION: 50.0000 ug/h | INTRAVENOUS | Status: DC

## 2019-10-21 MED ORDER — FENTANYL CITRATE (PF) 100 MCG/2ML IJ SOLN: 50.0000 ug | Freq: Once | INTRAMUSCULAR | Status: DC

## 2019-10-21 MED ORDER — ROCURONIUM BROMIDE 50 MG/5ML IV SOLN
50.0000 mg | Freq: Once | INTRAVENOUS | Status: AC
  Administered 2019-10-21: 50 mg via INTRAVENOUS
  Filled 2019-10-21: qty 1

## 2019-10-21 MED ORDER — VASOPRESSIN 20 UNIT/ML IV SOLN
0.0300 [IU]/min | INTRAVENOUS | Status: DC
  Filled 2019-10-21: qty 2

## 2019-10-21 MED ORDER — PROPOFOL 1000 MG/100ML IV EMUL: 0.0000 ug/kg/min | INTRAVENOUS | Status: DC

## 2019-10-22 NOTE — Procedures (Signed)
Intubation Procedure Note Tynae Dreessen [id number] 11-10-1970  Procedure: Intubation Indications: Respiratory insufficiency  Procedure Details Consent: Unable to obtain consent because of emergent medical necessity. Time Out: Verified patient identification, verified procedure, site/side was marked, verified correct patient position, special equipment/implants available, medications/allergies/relevent history reviewed, required imaging and test results available.  Performed  Maximum sterile technique was used including gloves, gown, hand hygiene and mask.  4    Evaluation Hemodynamic Status: Persistent hypotension treated with pressors and fluid; O2 sats: currently acceptable Patient's Current Condition: unstable Complications: No apparent complications Patient did tolerate procedure well. Chest X-ray ordered to verify placement.  CXR: pending.   Procedure was performed using Glidescope, of which passage of 7.5 cm ETT through the vocal cords was visualized.    Color change noted with CO2 detector, bilateral breath sounds auscultated.  Tube was secured at the 23 cm mark at the lip.  Pt was minimally responsive, given 20 Etomidate and 50 Rocuronium.    Darel Hong, AGACNP-BC [facility] Pager: [phone]  Bradly Bienenstock 27-Oct-2019

## 2019-10-22 NOTE — Progress Notes (Addendum)
0500- Pt continues to be hypoxic with SpO2 saturations around 80%,  remains awake, A&O, able to talk in full sentences, RR ranges 30's to 40.  Up to this point, she has not been consistently compliant with proning.  Had conversation with pt that she needs to prone, or that she will have to be intubated.  Pt does state she would like to avoid intubation if possible, and will attempt to prone.  Discussed with Dr. Lanney Gins, he is in agreement for permissive hypoxia, proning, and holding off on intubation at this point.   [id number] - Pt currently proning.  O2 saturations have improved to mid 80's.  Work of breathing has improved, and RR has decreased to 20-30. Have called and updated pt's daughter Jodi Rice that her mother has improved when proning, and will hold off on intubation for now.     Darel Hong, AGACNP-BC [facility] Pager: [phone] Cell: [phone]

## 2019-10-22 NOTE — Plan of Care (Signed)

## 2019-10-22 NOTE — Death Summary Note (Signed)
DEATH SUMMARY   Patient Details  Name: Jodi Rice MRN: [id number] DOB: 07/28/1971  Admission/Discharge Information   Admit Date:  Oct 30, 2019  Date of Death:  04-Nov-2019  Time of Death:  06:54  Length of Stay: 5  Referring Physician: Elby Beck, FNP   Reason(s) for Hospitalization  COVID-19 pneumonia Acute hypoxic respiratory failure Diabetic ketoacidosis Acute renal failure  Diagnoses  Preliminary cause of death:3 COVID-19 Pneumonia Secondary Diagnoses (including complications and co-morbidities):  Active Problems:   COVID-19 Acute Hypoxic Respiratory Failure Diabetic ketoacidosis Acute renal failure  Brief Hospital Course (including significant findings, care, treatment, and services provided and events leading to death)  Jodi Rice is a 49 y.o. year old female with medical history significant for Type II Diabetes Mellitus, Peripheral Neuropathy, HTN, Hyperlipidemia, and Vitamin D Deficiency. She [facility] to [facility] ER on 10/30/23 with c/o nausea and shortness of breath. She was diagnosed with COVID-19 in the outpatient setting on 10/11/2019.   During current ER presentation lab results revealed Na+ 146, CO2 <7, glucose 389, BUN 27, creatinine 1.27, total bilirubin 2.4, LDH 306, troponin I 20, ferritin 331, lactic acid 4.3, wbc 22.7, fibrinogen >750, d-dimer >7,500, and abg ph 7.08/pCO2 <19/pO2 80/acid-base deficit 24/bicarb 3.9. Pt ruled in for DKA, therefore insulin gtt initiated. CXR concerning for viral pneumonia. Pt received ceftriaxone and azithromycin. She was subsequently admitted to ICU for additional workup and treatment.   On 12/30, Patient's respiratory status worsening this morning.  Early this morning she required between 8 and 10L high flow nasal cannula.  This rapidly increased to 14 L.  Attempted Lasix challenge with 40 mg IV.  Patient diuresed approximately 1000 cc (500 cc collected, 500cc incontinent in bed) however saturations  continued to worsen.  Saturations worsened to the point the patient required 55 L of warmed high flow nasal cannula.  Tachycardic and tachypneic as well.  No obvious use of accessory muscle.  Able to speak in short sentences.  Saturations worsened on exertional while coughing.  CXR revealed worsening bilateral pulmonary opacities.  Procalcitonin increased to 0.72, therefore she was placed back on Azithromycin & Rocephin for suspected secondary bacterial pneumonia, along with additional 40 mg IV Lasix.  She continued to require 100% HFNC, but remained awake and alert.  Plan was for permissive hypoxia, with recommended proning.  However, pt was not consistently compliant with proning.  On November 04, 2023 at around 0600, she suddenly became severely hypoxic with O2 sats in the 40's, unarousable, and with agonal respirations.  She was successfully intubated emergently with improvement in O2 sats to the 80's.  However, shortly after intubation, she became bradycardic which progressed to PEA.  CODE BLUE was called, with CPR and ACLS protocol initiated immediately.  Was able to briefly regain ROSC several times, but would quickly progress back to PEA.  CODE called after 45 minutes.  Pertinent Labs and Studies  Significant Diagnostic Studies CT ANGIO CHEST PE W OR WO CONTRAST  Result Date: 10/19/2019 CLINICAL DATA:  Hypoxemia EXAM: CT ANGIOGRAPHY CHEST WITH CONTRAST TECHNIQUE: Multidetector CT imaging of the chest was performed using the standard protocol during bolus administration of intravenous contrast. Multiplanar CT image reconstructions and MIPs were obtained to evaluate the vascular anatomy. CONTRAST:  2mL OMNIPAQUE IOHEXOL 350 MG/ML SOLN COMPARISON:  None. FINDINGS: Cardiovascular: Satisfactory opacification of the pulmonary arteries to the segmental level. No evidence of pulmonary embolism. Normal heart size. No pericardial effusion. Mediastinum/Nodes: Enlarged subcarinal lymph node measuring 18 mm in short  axis. Mild bilateral  hilar lymphadenopathy. No axillary lymphadenopathy. Mild esophageal wall thickening as can be seen with esophagitis. Normal thyroid gland. Normal trachea. Lungs/Pleura: Bilateral patchy areas of interstitial thickening and patchy areas of ground-glass opacities throughout bilateral lungs. Trace bilateral pleural effusions. No pneumothorax. Upper Abdomen: No acute upper abdominal abnormality. 3.2 x 2.6 cm pancreatic tail mass concerning for pancreatic malignancy until proven otherwise. Recommend further characterization with a non emergent MRI of the abdomen without and with intravenous contrast. Musculoskeletal: No acute osseous abnormality. No aggressive osseous lesion. Review of the MIP images confirms the above findings. IMPRESSION: 1. No evidence of pulmonary embolus. 2. Bilateral patchy interstitial thickening and ground-glass opacities throughout bilateral lungs as can be seen in multilobar pneumonia including atypical viral pneumonia such as COVID-19. 3. 3.2 x 2.6 cm pancreatic tail mass concerning for pancreatic malignancy until proven otherwise. Recommend further characterization with a non emergent MRI of the abdomen without and with intravenous contrast. Electronically Signed   By: Kathreen Devoid   On: 10/19/2019 18:59   DG Chest Port 1 View  Result Date: 10/20/2019 CLINICAL DATA:  Hypoxia EXAM: PORTABLE CHEST 1 VIEW COMPARISON:  October 16, 2019 FINDINGS: The central venous catheter has been removed. There is scattered bilateral pulmonary opacities which appear to have worsened since the prior chest x-ray dated 10/04/2019. There is no pneumothorax. No large pleural effusion. The heart size is normal. There is no acute osseous abnormality. IMPRESSION: 1. Worsening scattered bilateral pulmonary opacities since the prior chest x-ray dated 10/15/2019. 2. No pneumothorax. Status post removal of the right-sided central venous catheter. Electronically Signed   By: Constance Holster  M.D.   On: 10/20/2019 15:45   DG Chest Portable 1 View  Result Date: 10/20/2019 CLINICAL DATA:  Central line placement. EXAM: PORTABLE CHEST 1 VIEW COMPARISON:  10/12/2019 at 12:43 a.m. FINDINGS: Interval placement of right IJ central venous catheter with tip over the SVC. Lungs are hypoinflated with stable patchy bilateral airspace opacification over the mid to lower lungs likely multifocal pneumonia. No effusion. Cardiomediastinal silhouette and remainder of the exam is unchanged. IMPRESSION: 1. Stable patchy multifocal airspace process over the mid to lower lungs likely multifocal pneumonia. 2.  Right IJ central venous catheter with tip over the SVC. Electronically Signed   By: Marin Olp M.D.   On: 10/20/2019 03:56   DG Chest Port 1 View  Result Date: 10/20/2019 CLINICAL DATA:  Shortness of breath COVID positive EXAM: PORTABLE CHEST 1 VIEW COMPARISON:  December 20, 2011 FINDINGS: The heart size and mediastinal contours are within normal limits. Patchy airspace opacities are seen at both lower lungs. No pleural effusion. No acute osseous abnormality. IMPRESSION: Patchy airspace opacities at both lung bases, likely consistent with viral pneumonia. Electronically Signed   By: Prudencio Pair M.D.   On: 10/17/2019 01:58    Microbiology Recent Results (from the past 240 hour(s))  Novel Coronavirus, NAA ([facility])     Status: Abnormal   Collection Time: 10/11/19  1:37 PM   Specimen: Nasopharyngeal(NP) swabs in vial transport medium   NASOPHARYNGE  TESTING  Result Value Ref Range Status   SARS-CoV-2, NAA Detected (A) Not Detected Final    Comment: This nucleic acid amplification test was developed and its performance characteristics determined by [employer]. Nucleic acid amplification tests include PCR and TMA. This test has not been FDA cleared or approved. This test has been authorized by FDA under an Emergency Use Authorization (EUA). This test is only authorized for the duration of  time the  declaration that circumstances exist justifying the authorization of the emergency use of in vitro diagnostic tests for detection of SARS-CoV-2 virus and/or diagnosis of COVID-19 infection under section 564(b)(1) of the Act, 21 U.S.C. [id number]) (1), unless the authorization is terminated or revoked sooner. When diagnostic testing is negative, the possibility of a false negative result should be considered in the context of a patient's recent exposures and the presence of clinical signs and symptoms consistent with COVID-19. An individual without symptoms of COVID-19 and who is not shedding SARS-CoV-2 virus would  expect to have a negative (not detected) result in this assay.   Blood Culture (routine x 2)     Status: None   Collection Time: 10/04/2019  1:31 AM   Specimen: BLOOD  Result Value Ref Range Status   Specimen Description BLOOD LEFT ANTECUBITAL  Final   Special Requests   Final    BOTTLES DRAWN AEROBIC AND ANAEROBIC Blood Culture results may not be optimal due to an inadequate volume of blood received in culture bottles   Culture   Final    NO GROWTH 5 DAYS Performed at [facility], [address] [place], [place] [address]    Report Status 11/08/2019 FINAL  Final  Blood Culture (routine x 2)     Status: None   Collection Time: 10/04/2019  2:39 AM   Specimen: BLOOD  Result Value Ref Range Status   Specimen Description BLOOD RIGHT HAND  Final   Special Requests   Final    BOTTLES DRAWN AEROBIC ONLY Blood Culture results may not be optimal due to an inadequate volume of blood received in culture bottles   Culture   Final    NO GROWTH 5 DAYS Performed at [facility], [address] [place], [place] [address]    Report Status 08-Nov-2019 FINAL  Final  MRSA PCR Screening     Status: None   Collection Time: 09/23/2019  6:37 AM   Specimen: Nasopharyngeal  Result Value Ref Range Status   MRSA by PCR NEGATIVE NEGATIVE Final    Comment:        The  GeneXpert MRSA Assay (FDA approved for NASAL specimens only), is one component of a comprehensive MRSA colonization surveillance program. It is not intended to diagnose MRSA infection nor to guide or monitor treatment for MRSA infections. Performed at [facility], [address] [place], [place] [address]     Lab Basic Metabolic Panel: Recent Labs  Lab 10/17/19 0535 10/18/19 0653 10/19/19 0505 10/20/19 0525 November 08, 2019 0506  NA 143 139 140 140 144  K 3.8 3.2* 3.4* 3.8 3.6  CL 113* 110 106 103 104  CO2 20* 22 23 26  34*  GLUCOSE 172* 176* 211* 230* 148*  BUN 25* 16 9 10 12   CREATININE 0.71 0.53 0.51 0.48 0.64  CALCIUM 7.9* 7.7* 7.7* 7.6* 7.8*  MG 2.3 2.0 2.1 1.9 1.8  PHOS 1.6* 1.3* 2.9 2.3* 1.4*   Liver Function Tests: Recent Labs  Lab 10/17/19 0535 10/18/19 0653 10/19/19 0505 10/20/19 0525 11-08-19 0506  AST 21 17 13* 18 23  ALT 10 11 9 10 10   ALKPHOS 76 75 79 87 79  BILITOT 0.8 0.5 0.7 0.7 0.4  PROT 6.0* 5.6* 6.1* 6.3* 6.5  ALBUMIN 2.2* 2.1* 2.1* 2.0* 2.0*   Recent Labs  Lab 10/14/19 1250  LIPASE 14   No results for input(s): AMMONIA in the last 168 hours. CBC: Recent Labs  Lab 10/17/19 0535 10/18/19 0653 10/19/19 0505  10/20/19 0525 13-Nov-2019 0506  WBC 23.1* 16.7* 15.1* 17.3* 16.8*  NEUTROABS 20.5* 14.4* 13.1* 15.7* 15.6*  HGB 12.1 11.7* 11.7* 11.3* 11.3*  HCT 35.5* 33.7* 34.1* 32.2* 34.0*  MCV 84.5 83.4 84.2 81.1 84.8  PLT 432* 453* 482* 465* 401*   Cardiac Enzymes: No results for input(s): CKTOTAL, CKMB, CKMBINDEX, TROPONINI in the last 168 hours. Sepsis Labs: Recent Labs  Lab 10/15/2019 0239 09/29/2019 0907 10/07/2019 1257 10/18/19 0653 10/19/19 0505 10/20/19 0525 10/20/19 2051 2019-11-13 0506  PROCALCITON 0.23  --   --   --   --   --  0.72 <0.10  WBC 22.7*  --   --  16.7* 15.1* 17.3*  --  16.8*  LATICACIDVEN 4.3* 2.4* 1.1  --   --   --   --   --     Procedures/Operations  Endotracheal Intubation  12/31>>12/31           Darel Hong, AGACNP-BC [facility] Pager: [phone]  Bradly Bienenstock 13-Nov-2019, 8:24 AM

## 2019-10-22 NOTE — Progress Notes (Signed)
   10-Nov-2019 0800  Clinical Encounter Type  Visited With Family  Visit Type Initial;Spiritual support  Referral From Nurse  Consult/Referral To Chaplain  Spiritual Encounters  Spiritual Needs Emotional;Grief support  Stress Factors  Family Stress Factors Loss  Chaplain received page for death of patient. Chaplain arrived and gave her condolences, and proceeded to patient's room with family. However, patient was Covid 19 positive.

## 2019-10-22 NOTE — Progress Notes (Signed)
0300-Pt becoming more hypoxic, RR increased to the 40's.  Still remains alert and oriented.  Given worsening hypoxia and increased WOB, concern for impending respiratory arrest.  Will proceed with intubation.  Called and dicussed with pt's daughter Jodi Rice of need for intubation.  She is in agreement for proceeding with intubation.  She does however want to talk to her mother on the phone first before proceeding with intubation.  0330- Preparing to enter the room for intubation after she has talked to her daughter, pt is side proning, and O2 saturations have increased to mid 80's with improvement of RR to 20-30.  Will hold off on intubation as this time.  Continue to monitor and encourage proning.     Darel Hong, AGACNP-BC [facility] Pager: [phone]

## 2019-10-22 NOTE — Progress Notes (Addendum)
TOD called at 0654 hrs. Dr. Lanney Gins and Dr. Louanne Belton both notified of TOD. Dr. Lanney Gins to sign Death Certificate.   Patient's son and Sister visited patient in the room under supervision of this RN. Visit lasted < 15 minutes.

## 2019-10-22 NOTE — Progress Notes (Addendum)
After being placed on vent her SpO2 saturations improved to the 80's. However, shortly after intubation she becamse hypotensive, bradycardic, progressing to PEA.  CPR immediately initiated, along with ACLS protocol.  She received multiple rounds of Epi, Atropine, Calcium, Bicarb.  Multiple times pulse was temporarily regained, however she would quickly progress back to PEA.  CODE BLUE initiated @ [id number], and ended at 0654 with pt expiring.  See [place] for full details.    Darel Hong, AGACNP-BC [facility] Pager: [phone]

## 2019-10-22 NOTE — Progress Notes (Signed)
RT x2 at bedside for intubation. Patient was successfully intubated by NP; 7.5ETT, secured at 23cm at the lip, positive color, bilateral chest rise. Shortly after placing patient on ventilator, patient became pulseless. CPR initiated and patient was taken off of ventilator and bagged with HEPA filter and PEEP valve. RT x1 remained at bedside for duration of CPR.

## 2019-10-22 NOTE — Progress Notes (Signed)
At 0600, this nurse noticed the patient's SPO2 rapidly dropping into the 60s and 50s. After entering the room, patient was found to be unresponsive and minimally breathing. At this time the patient still had a pulse. Rescue breaths were begun with a bag valve mask and additional nurses and the NP entered the room for urgent intubation. After intubation, the patient was found to be in PEA and CPR was initiated.  See code sheet for further.  Cameron Ali, RN

## 2019-10-22 DEATH — deceased

## 2019-10-24 LAB — LEGIONELLA PNEUMOPHILA SEROGP 1 UR AG: L. pneumophila Serogp 1 Ur Ag: NEGATIVE

## 2019-10-28 ENCOUNTER — Ambulatory Visit: Payer: 59 | Admitting: Psychology

## 2019-11-04 ENCOUNTER — Ambulatory Visit: Payer: 59 | Admitting: Psychology

## 2019-11-11 ENCOUNTER — Ambulatory Visit: Payer: 59 | Admitting: Psychology

## 2019-11-18 ENCOUNTER — Ambulatory Visit: Payer: 59 | Admitting: Psychology

## 2019-11-25 ENCOUNTER — Ambulatory Visit: Payer: 59 | Admitting: Psychology

## 2019-12-02 ENCOUNTER — Ambulatory Visit: Payer: 59 | Admitting: Psychology

## 2019-12-09 ENCOUNTER — Ambulatory Visit: Payer: 59 | Admitting: Psychology

## 2019-12-16 ENCOUNTER — Ambulatory Visit: Payer: 59 | Admitting: Psychology

## 2019-12-23 ENCOUNTER — Ambulatory Visit: Payer: 59 | Admitting: Psychology

## 2019-12-30 ENCOUNTER — Ambulatory Visit: Payer: 59 | Admitting: Psychology

## 2020-01-06 ENCOUNTER — Ambulatory Visit: Payer: 59 | Admitting: Psychology

## 2020-01-12 ENCOUNTER — Encounter: Payer: 59 | Admitting: Family Medicine

## 2021-12-08 IMAGING — DX DG CHEST 1V PORT
1 series · 1 of 1 positions shown · non-contrast
Comparison: October 16, 2019

CLINICAL DATA: Hypoxia

EXAM:
PORTABLE CHEST 1 VIEW

[chest ap]
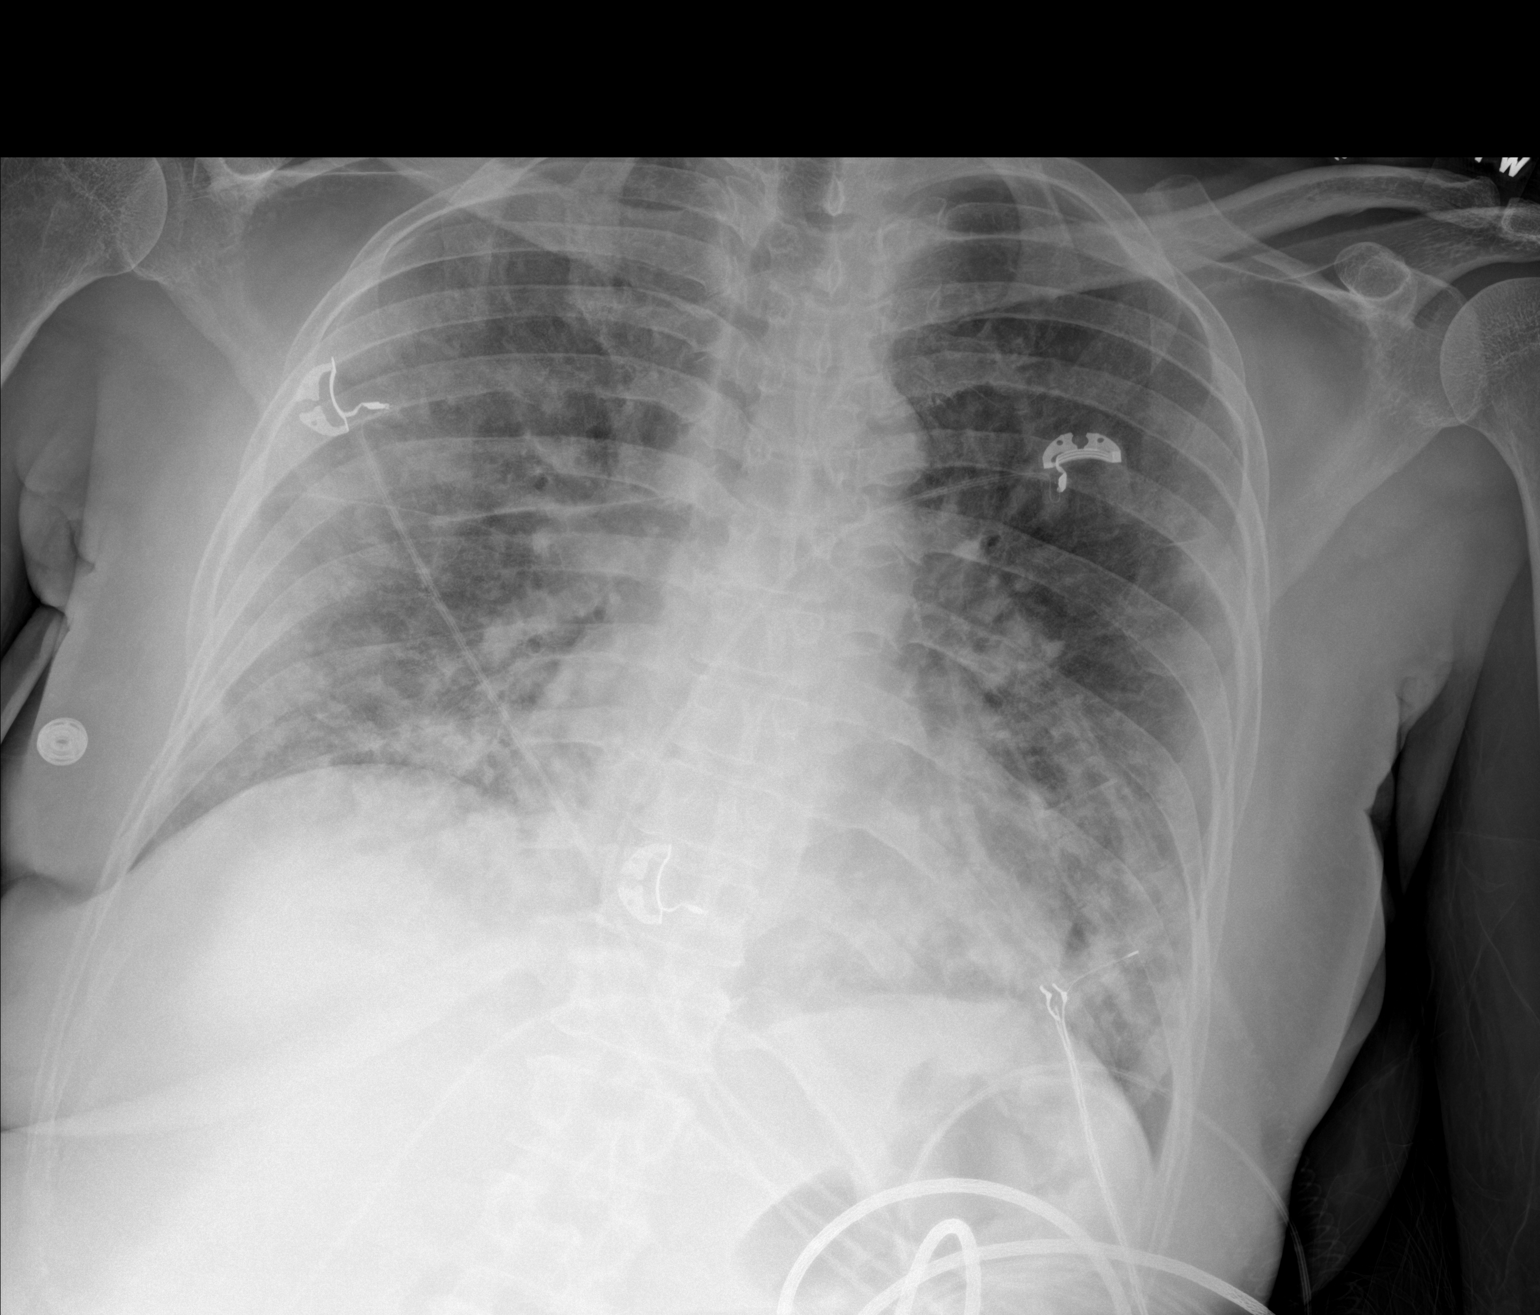

[1 of 1 positions shown; findings below may reference images not displayed]

FINDINGS: The central venous catheter has been removed. There is scattered
bilateral pulmonary opacities which appear to have worsened since
the prior chest x-ray dated 10/16/2019. There is no pneumothorax. No
large pleural effusion. The heart size is normal. There is no acute
osseous abnormality.
IMPRESSION: 1. Worsening scattered bilateral pulmonary opacities since the prior
chest x-ray dated 10/16/2019.
[DATE]. No pneumothorax. Status post removal of the right-sided central
venous catheter.
